# Patient Record
Sex: Male | Born: 1965 | Race: White | Hispanic: No | Marital: Married | Smoking: Never smoker
Health system: Southern US, Community
[De-identification: ages and names within clinical notes are randomized; demographics above are authoritative.]

## PROBLEM LIST (undated history)

## (undated) DIAGNOSIS — Z87442 Personal history of urinary calculi: Secondary | ICD-10-CM

## (undated) DIAGNOSIS — K219 Gastro-esophageal reflux disease without esophagitis: Secondary | ICD-10-CM

## (undated) DIAGNOSIS — N471 Phimosis: Secondary | ICD-10-CM

## (undated) DIAGNOSIS — Z8701 Personal history of pneumonia (recurrent): Secondary | ICD-10-CM

## (undated) DIAGNOSIS — N200 Calculus of kidney: Secondary | ICD-10-CM

## (undated) DIAGNOSIS — M519 Unspecified thoracic, thoracolumbar and lumbosacral intervertebral disc disorder: Secondary | ICD-10-CM

## (undated) DIAGNOSIS — E785 Hyperlipidemia, unspecified: Secondary | ICD-10-CM

## (undated) DIAGNOSIS — Z8739 Personal history of other diseases of the musculoskeletal system and connective tissue: Secondary | ICD-10-CM

## (undated) HISTORY — DX: Calculus of kidney: N20.0

## (undated) HISTORY — DX: Hyperlipidemia, unspecified: E78.5

## (undated) HISTORY — PX: NO PAST SURGERIES: SHX2092

## (undated) HISTORY — PX: WISDOM TOOTH EXTRACTION: SHX21

## (undated) HISTORY — DX: Personal history of pneumonia (recurrent): Z87.01

---

## 1996-11-12 HISTORY — PX: COLONOSCOPY: SHX174

## 2000-07-08 ENCOUNTER — Encounter: Payer: Self-pay | Admitting: Emergency Medicine

## 2000-07-08 ENCOUNTER — Emergency Department (HOSPITAL_COMMUNITY): Admission: EM | Admit: 2000-07-08 | Discharge: 2000-07-08 | Payer: Self-pay | Admitting: Emergency Medicine

## 2003-05-08 ENCOUNTER — Emergency Department (HOSPITAL_COMMUNITY): Admission: EM | Admit: 2003-05-08 | Discharge: 2003-05-08 | Payer: Self-pay

## 2006-01-25 ENCOUNTER — Ambulatory Visit: Payer: Self-pay | Admitting: Internal Medicine

## 2006-06-22 ENCOUNTER — Emergency Department (HOSPITAL_COMMUNITY): Admission: EM | Admit: 2006-06-22 | Discharge: 2006-06-22 | Payer: Self-pay | Admitting: Emergency Medicine

## 2006-12-09 ENCOUNTER — Ambulatory Visit: Payer: Self-pay | Admitting: Internal Medicine

## 2007-03-11 ENCOUNTER — Ambulatory Visit: Payer: Self-pay | Admitting: Internal Medicine

## 2007-03-12 ENCOUNTER — Inpatient Hospital Stay (HOSPITAL_COMMUNITY): Admission: EM | Admit: 2007-03-12 | Discharge: 2007-03-14 | Payer: Self-pay | Admitting: Emergency Medicine

## 2007-03-28 ENCOUNTER — Ambulatory Visit: Payer: Self-pay | Admitting: Internal Medicine

## 2007-06-24 DIAGNOSIS — G4733 Obstructive sleep apnea (adult) (pediatric): Secondary | ICD-10-CM

## 2007-06-24 DIAGNOSIS — N200 Calculus of kidney: Secondary | ICD-10-CM | POA: Insufficient documentation

## 2007-06-25 ENCOUNTER — Ambulatory Visit: Payer: Self-pay | Admitting: Internal Medicine

## 2007-07-02 ENCOUNTER — Telehealth (INDEPENDENT_AMBULATORY_CARE_PROVIDER_SITE_OTHER): Payer: Self-pay | Admitting: *Deleted

## 2007-07-03 ENCOUNTER — Ambulatory Visit: Payer: Self-pay | Admitting: Internal Medicine

## 2007-07-04 ENCOUNTER — Telehealth: Payer: Self-pay | Admitting: Internal Medicine

## 2008-01-15 ENCOUNTER — Encounter: Payer: Self-pay | Admitting: Internal Medicine

## 2008-03-02 ENCOUNTER — Ambulatory Visit: Payer: Self-pay | Admitting: Internal Medicine

## 2008-06-18 IMAGING — CR DG CHEST 1V
1 series · 1 of 1 positions shown · non-contrast
Comparison: None.

CLINICAL DATA: Chest tightness/low back pain. 
 CHEST ? 1 VIEW:

[view not recorded]
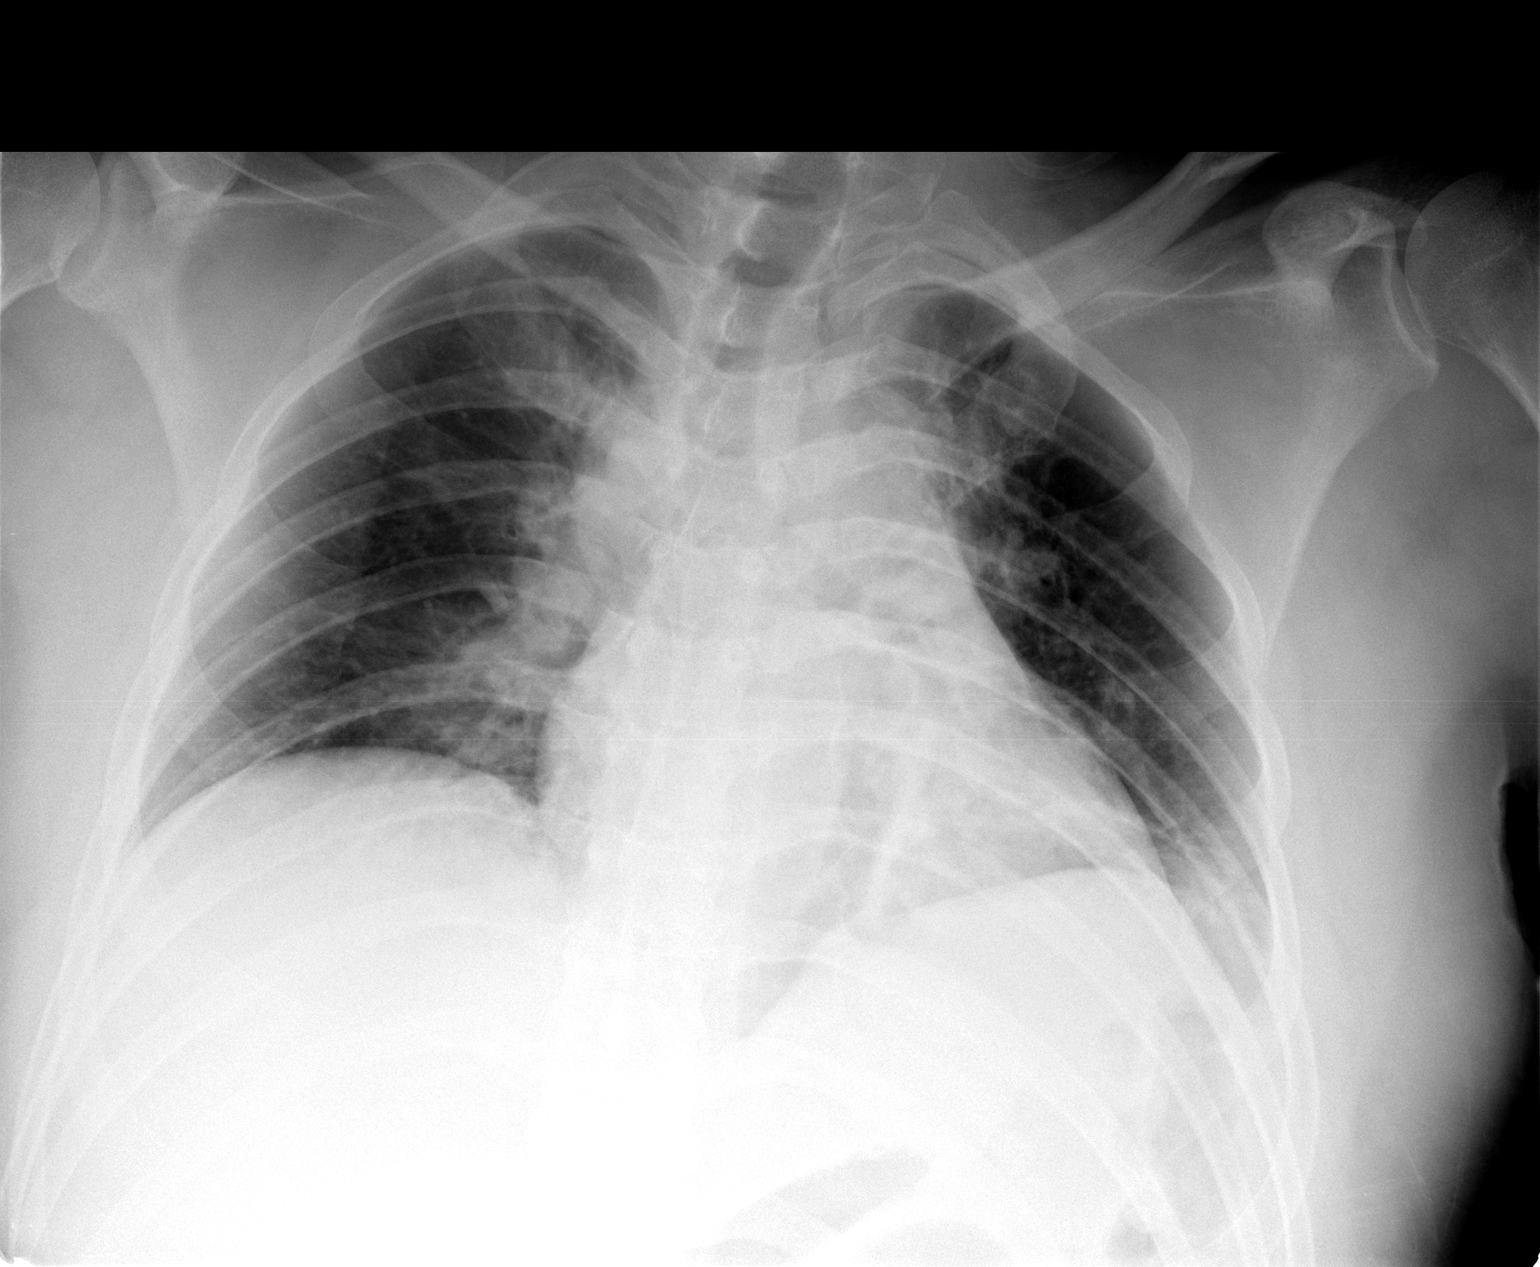

[1 of 1 positions shown; findings below may reference images not displayed]

FINDINGS: This is an AP supine exam with a low level of inspiration.  There are bilateral lobe areas of subsegmental atelectasis or scar.  No definite pneumonia or heart failure.  The mediastinum appears slightly widened but is within normal limits for a supine exam.  
 No gross pleural fluid or pneumothorax.
IMPRESSION: 1.  Limited exam due to AP supine position. 
 2.  Bilateral lower lobe subsegmental atelectasis or scar.

## 2008-11-10 ENCOUNTER — Ambulatory Visit: Payer: Self-pay | Admitting: Internal Medicine

## 2008-11-22 ENCOUNTER — Ambulatory Visit: Payer: Self-pay | Admitting: Internal Medicine

## 2009-08-08 ENCOUNTER — Ambulatory Visit: Payer: Self-pay | Admitting: Internal Medicine

## 2010-03-17 ENCOUNTER — Ambulatory Visit: Payer: Self-pay | Admitting: Internal Medicine

## 2010-03-17 LAB — CONVERTED CEMR LAB
Blood in Urine, dipstick: NEGATIVE
Ketones, urine, test strip: NEGATIVE
Nitrite: NEGATIVE
Protein, U semiquant: NEGATIVE
Specific Gravity, Urine: 1.02
Urobilinogen, UA: 0.2
WBC Urine, dipstick: NEGATIVE

## 2010-03-21 LAB — CONVERTED CEMR LAB
ALT: 242 units/L — ABNORMAL HIGH (ref 0–53)
AST: 96 units/L — ABNORMAL HIGH (ref 0–37)
Alkaline Phosphatase: 53 units/L (ref 39–117)
BUN: 14 mg/dL (ref 6–23)
Basophils Absolute: 0 10*3/uL (ref 0.0–0.1)
Creatinine, Ser: 0.9 mg/dL (ref 0.4–1.5)
Eosinophils Relative: 1.1 % (ref 0.0–5.0)
Glucose, Bld: 89 mg/dL (ref 70–99)
HDL: 40 mg/dL (ref >39.00)
Hemoglobin: 14.5 g/dL (ref 13.0–17.0)
Lymphocytes Relative: 29 % (ref 12.0–46.0)
Monocytes Relative: 8.2 % (ref 3.0–12.0)
Neutro Abs: 4.5 10*3/uL (ref 1.4–7.7)
Platelets: 146 10*3/uL — ABNORMAL LOW (ref 150.0–400.0)
Potassium: 4.8 meq/L (ref 3.5–5.1)
RDW: 13.7 % (ref 11.5–14.6)
Sodium: 143 meq/L (ref 135–145)
Total Bilirubin: 0.7 mg/dL (ref 0.3–1.2)
Triglycerides: 128 mg/dL (ref 0.0–149.0)
VLDL: 25.6 mg/dL (ref 0.0–40.0)
WBC: 7.3 10*3/uL (ref 4.5–10.5)

## 2010-03-24 ENCOUNTER — Ambulatory Visit: Payer: Self-pay | Admitting: Internal Medicine

## 2010-03-24 DIAGNOSIS — R740 Nonspecific elevation of levels of transaminase and lactic acid dehydrogenase [LDH]: Secondary | ICD-10-CM

## 2010-03-24 DIAGNOSIS — R079 Chest pain, unspecified: Secondary | ICD-10-CM | POA: Insufficient documentation

## 2010-03-24 LAB — CONVERTED CEMR LAB
ALT: 227 units/L — ABNORMAL HIGH (ref 0–53)
AST: 89 units/L — ABNORMAL HIGH (ref 0–37)
Alkaline Phosphatase: 54 units/L (ref 39–117)
Bilirubin, Direct: 0.1 mg/dL (ref 0.0–0.3)
Total Protein: 7.1 g/dL (ref 6.0–8.3)

## 2010-03-31 ENCOUNTER — Encounter: Admission: RE | Admit: 2010-03-31 | Discharge: 2010-03-31 | Payer: Self-pay | Admitting: Internal Medicine

## 2010-08-21 ENCOUNTER — Ambulatory Visit: Payer: Self-pay | Admitting: Family Medicine

## 2010-08-21 ENCOUNTER — Ambulatory Visit: Payer: Self-pay

## 2010-08-21 ENCOUNTER — Telehealth (INDEPENDENT_AMBULATORY_CARE_PROVIDER_SITE_OTHER): Payer: Self-pay | Admitting: *Deleted

## 2010-08-21 DIAGNOSIS — R609 Edema, unspecified: Secondary | ICD-10-CM

## 2010-08-21 DIAGNOSIS — M79609 Pain in unspecified limb: Secondary | ICD-10-CM | POA: Insufficient documentation

## 2010-10-09 ENCOUNTER — Ambulatory Visit: Payer: Self-pay | Admitting: Family Medicine

## 2010-12-12 NOTE — Progress Notes (Signed)
----   Converted from flag ---- ---- 08/21/2010 4:14 PM, Danise Edge MD wrote: notify Arlys John scan neg for DVT or clot, official complete document to follow  ---- 08/21/2010 11:01 AM, Missy Al-Rammal, RVT, RDCS wrote: Left leg negative for DVT or superficial clot.  Area of tenderness shows no maas/cyst. ------------------------------  Phone Note Outgoing Call   Summary of Call: I informed pt that the scan came back neg for a DVT or Clot and pt would like to know what it is then and what he should do? Initial call taken by: Josph Macho RMA,  August 21, 2010 4:20 PM  Follow-up for Phone Call        Muscle strain, can take 2-6 weeks to resolve. try moist heat, aspercreme as needed and Aleve 220 mg by mouth two times a day as needed pain with food for next couple weeks. Report if symptoms worsen. For mild swelling at ankles, decrease sodium intake, elevate feet above heart for 10-15 minutes two times a day and consider compression socks if swelling persists Follow-up by: Danise Edge MD,  August 21, 2010 5:03 PM  Additional Follow-up for Phone Call Additional follow up Details #1::        Patient informed Additional Follow-up by: Josph Macho RMA,  August 22, 2010 8:48 AM

## 2010-12-12 NOTE — Assessment & Plan Note (Signed)
Summary: LUMP & SWELLING LEG/SW PT/PS   Vital Signs:  Patient profile:   45 year old male Height:      75.5 inches (191.77 cm) Weight:      285 pounds (129.55 kg) O2 Sat:      98 % on Room air Temp:     98.2 degrees F (36.78 degrees C) oral Pulse rate:   82 / minute BP sitting:   120 / 80  (left arm) Cuff size:   large  Vitals Entered By: Josph Macho RMA (August 21, 2010 8:57 AM)  O2 Flow:  Room air CC: Lump and swelling in left leg X4 days- pain travels from left leg to calf- warm spot with lump on left calf/ CF Is Patient Diabetic? No   History of Present Illness: Patient in today for evaluation of left sided calf pain. Late last week noted some mild b/l calf pain/stiffness after wearing his sandels. The calf pain on the right has resolved and never had any associated swelling. Over the past several days his left calf pain has worsened with some swelling at the ankle as well. They noted some mild warmth, redness over the posterior, caudal calf yesterday but is better today. No CP/palp/SOB/GI or GU c/o. No smoking or trauma.  Current Medications (verified): 1)  Aspirin .... One- Three Daily  Allergies (verified): No Known Drug Allergies  Past History:  Past medical history reviewed for relevance to current acute and chronic problems. Social history (including risk factors) reviewed for relevance to current acute and chronic problems.  Past Medical History: Reviewed history from 11/10/2008 and no changes required. 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 Used to see Dr Aldean Ast Nephrolithiasis, hx of  Social History: Reviewed history from 11/10/2008 and no changes required. Never Smoked Occupation: Physicist, medical  Married Alcohol use-yes  Review of Systems      See HPI  Physical Exam  General:  Well-developed,well-nourished,in no acute distress; alert,appropriate and cooperative throughout  examination Head:  Normocephalic and atraumatic without obvious abnormalities. No apparent alopecia or balding. Lungs:  Normal respiratory effort, chest expands symmetrically. Lungs are clear to auscultation, no crackles or wheezes. Heart:  Normal rate and regular rhythm. S1 and S2 normal without gallop, murmur, click, rub or other extra sounds. Abdomen:  Bowel sounds positive,abdomen soft and non-tender without masses, organomegaly or hernias noted. Extremities:  RLE no edema, pain or joint instability. Left lower extremity with swelling around ankle, no redness or knot over calf. Mild swelling over posterior knee, no tenderness. No ligamentous laxity or joint line tenderness. Psych:  Cognition and judgment appear intact. Alert and cooperative with normal attention span and concentration. No apparent delusions, illusions, hallucinations   Impression & Recommendations:  Problem # 1:  CALF PAIN, LEFT (ICD-729.5)  Orders: Doppler Referral (Doppler) May use Aleve two times a day and encouraged heat and ice and stretching if Doppler is neg for DVT  Complete Medication List: 1)  Aspirin  .... One- three daily

## 2010-12-12 NOTE — Miscellaneous (Signed)
Summary: Orders Update  Clinical Lists Changes  Problems: Added new problem of EDEMA, LIMB (ICD-782.3) Orders: Added new Test order of Venous Duplex Lower Extremity (Venous Duplex Lower) - Signed 

## 2010-12-12 NOTE — Assessment & Plan Note (Signed)
Summary: back pain//ccm   Vital Signs:  Patient profile:   45 year old male Weight:      282 pounds Temp:     98.5 degrees F oral BP sitting:   120 / 84  (left arm) Cuff size:   large  Vitals Entered By: Sid Falcon LPN (October 09, 2010 3:32 PM)  History of Present Illness: Acute visit for low back pain. Onset one week ago. No injury. History of similar pain in the past. Pain is lower lumbar greater on right. Pain is sharp. Worse with change of position. Has tried cyclobenzaprine and diclofenac without improvement. No numbness or weakness. No radiculopathy symptoms. No appetite or weight changes. No dysuria.  Allergies (verified): No Known Drug Allergies  Past History:  Past Medical History: Last updated: 11/10/2008 Low back pain  Pneumonia, hx of Hx of uric acid kidney stones  10 years  onset last one 2 year ago. Used to see Dr Aldean Ast Nephrolithiasis, hx of PMH reviewed for relevance  Review of Systems  The patient denies anorexia, fever, weight loss, hematuria, incontinence, and muscle weakness.    Physical Exam  General:  Well-developed,well-nourished,in no acute distress; alert,appropriate and cooperative throughout examination Lungs:  Normal respiratory effort, chest expands symmetrically. Lungs are clear to auscultation, no crackles or wheezes. Heart:  Normal rate and regular rhythm. S1 and S2 normal without gallop, murmur, click, rub or other extra sounds. Extremities:  No clubbing, cyanosis, edema, or deformity noted with normal full range of motion of all joints.  straight leg raise is negative Neurologic:  full strength with plantar flexion and dorsiflexion bilaterally. Normal sensory function at times.  DTRs are trace ankle and knee bilateral.   Impression & Recommendations:  Problem # 1:  LUMBAGO (ICD-724.2) nonfocal neuro.  Trial of pred taper and refill cyclobenzaprine and hydrocodone for as needed use. His updated medication list for this problem  includes:    Cyclobenzaprine Hcl 10 Mg Tabs (Cyclobenzaprine hcl) ..... One by mouth q 8 hours as needed muscle spasm    Hydrocodone-acetaminophen 5-325 Mg Tabs (Hydrocodone-acetaminophen) .Marland Kitchen... 1-2 by mouth q 4-6 hours as needed pain  Complete Medication List: 1)  Aspirin  .... One- three daily 2)  Prednisone 10 Mg Tabs (Prednisone) .... Taper as follows:  6-5-4-4-3-3-2-1 3)  Cyclobenzaprine Hcl 10 Mg Tabs (Cyclobenzaprine hcl) .... One by mouth q 8 hours as needed muscle spasm 4)  Hydrocodone-acetaminophen 5-325 Mg Tabs (Hydrocodone-acetaminophen) .Marland Kitchen.. 1-2 by mouth q 4-6 hours as needed pain  Patient Instructions: 1)  Touch base if back pain no better in 2 weeks. 2)  Most patients (90%) with low back pain will improve with time ( 2-6 weeks). Keep active but avoid activities that are painful. Apply moist heat and/or ice to lower back several times a day.  Prescriptions: HYDROCODONE-ACETAMINOPHEN 5-325 MG TABS (HYDROCODONE-ACETAMINOPHEN) 1-2 by mouth q 4-6 hours as needed pain  #30 x 0   Entered and Authorized by:   Evelena Peat MD   Signed by:   Evelena Peat MD on 10/09/2010   Method used:   Print then Give to Patient   RxID:   [id number] CYCLOBENZAPRINE HCL 10 MG TABS (CYCLOBENZAPRINE HCL) one by mouth q 8 hours as needed muscle spasm  #30 x 0   Entered and Authorized by:   Evelena Peat MD   Signed by:   Evelena Peat MD on 10/09/2010   Method used:   Electronically to        [facility] (  retail)       [address]       [place], [place]  [address]       Ph: [phone]       Fax: [phone]   RxID:   [id number] PREDNISONE 10 MG TABS (PREDNISONE) taper as follows:  6-5-4-4-3-3-2-1  #28 x 0   Entered and Authorized by:   Evelena Peat MD   Signed by:   Evelena Peat MD on 10/09/2010   Method used:   Electronically to        [facility] ([facility]       [address]       [place], [place]  [address]       Ph: [phone]       Fax: [phone]   RxID:   [id number]    Orders Added: 1)  Est. Patient Level III [[address]]

## 2010-12-12 NOTE — Assessment & Plan Note (Signed)
Summary: CPX // RS   Vital Signs:  Patient profile:   45 year old Mccullough Height:      75.5 inches Weight:      280 pounds BMI:     34.66 Pulse rate:   60 / minute Pulse rhythm:   regular Resp:     12 per minute BP sitting:   128 / 80  (left arm) Cuff size:   regular  Vitals Entered By: Gladis Riffle, RN (Mar 24, 2010 10:01 AM)  Nutrition Counseling: Patient's BMI is greater than 25 and therefore counseled on weight management options. CC: cpx, labs done Is Patient Diabetic? No   CC:  cpx and labs done.  History of Present Illness: CPX  THE reason i made a physical is Chest pain reports 1 month ago he had a few episodes of CP each for 45 seconds no SOB, PND no exertional sxs no recurrent sxs denies GERD  Preventive Screening-Counseling & Management  Alcohol-Tobacco     Smoking Status: never  Current Problems (verified): 1)  ? of Gout, Unspecified  (ICD-274.9) 2)  Calculus, Kidney  (ICD-592.0) 3)  Obstructive Sleep Apnea  (ICD-327.23)  Current Medications (verified): 1)  Hydrocodone-Acetaminophen 5-325 Mg Tabs (Hydrocodone-Acetaminophen) .Marland Kitchen.. 1- 2  By Mouth Up To 4 Times Per Day As Needed For Pain 2)  Cyclobenzaprine Hcl 10 Mg  Tabs (Cyclobenzaprine Hcl) .... Take 1 Tablet By Mouth Three Times A Day As Needed  Allergies (verified): No Known Drug Allergies  Past History:  Past Medical History: Last updated: 11/10/2008 Low back pain  Pneumonia, hx of Hx of uric acid kidney stones  10 years  onset last one 2 year ago. Used to see Dr Aldean Ast Nephrolithiasis, hx of  Past Surgical History: Last updated: 06/24/2007 Denies surgical history  Family History: Last updated: 03/24/2010 neg gout  father-healthy mother-healthy  Social History: Last updated: 11/10/2008 Never Smoked Occupation: Physicist, medical  Married Alcohol use-yes  Risk Factors: Smoking Status: never (03/24/2010)  Family History: neg gout  father-healthy mother-healthy  Physical  Exam  General:  alert and well-developed.   Head:  normocephalic and atraumatic.   Eyes:  pupils equal and pupils round.   Ears:  R ear normal and L ear normal.   Nose:  no external deformity and no external erythema.   Neck:  No deformities, masses, or tenderness noted. Lungs:  normal respiratory effort and no intercostal retractions.   Heart:  normal rate and regular rhythm.   Abdomen:  soft and non-tender.  obese Msk:  No deformity or scoliosis noted of thoracic or lumbar spine.   Pulses:  R radial normal and L radial normal.   Neurologic:  cranial nerves II-XII intact and gait normal.   Skin:  turgor normal and color normal.   Psych:  memory intact for recent and remote and good eye contact.     Impression & Recommendations:  Problem # 1:  Preventive Health Care (ICD-V70.0) health maint utd needs to lose weight regular exercise low calorie diet  Problem # 2:  CHEST PAIN (ICD-786.50) unclear etiology ekg normal no further eval at this time he will call if sxs recur  Problem # 3:  TRANSAMINASES, SERUM, ELEVATED (ICD-790.4) unclear cause repeat labs check viral serologies Orders: Venipuncture (44034) TLB-Hepatic/Liver Function Pnl (80076-HEPATIC) T-Hepatitis B Surface Antigen (74259-56387) T-Hepatitis C Anti HCV (56433) T-Hepatitis B Surface Antibody (29518-84166)  Complete Medication List: 1)  Hydrocodone-acetaminophen 5-325 Mg Tabs (Hydrocodone-acetaminophen) .Marland Kitchen.. 1- 2  by mouth up to 4 times  per day as needed for pain 2)  Cyclobenzaprine Hcl 10 Mg Tabs (Cyclobenzaprine hcl) .... Take 1 tablet by mouth three times a day as needed  Other Orders: Tdap => 72yrs IM (57846) Admin 1st Vaccine (96295)   Immunizations Administered:  Tetanus Vaccine:    Vaccine Type: Tdap    Site: left deltoid    Mfr: GlaxoSmithKline    Dose: 0.5 ml    Route: IM    Given by: Gladis Riffle, RN    Exp. Date: 02/04/2012    Lot #: [id number]  Prevention & Chronic  Care Immunizations   Influenza vaccine: Not documented    Tetanus booster: 03/24/2010: Tdap    Pneumococcal vaccine: Not documented  Other Screening   Smoking status: never  (03/24/2010)  Lipids   Total Cholesterol: 211  (03/17/2010)   LDL: Not documented   LDL Direct: 157.3  (03/17/2010)   HDL: 40.00  (03/17/2010)   Triglycerides: 128.0  (03/17/2010)

## 2011-02-02 ENCOUNTER — Ambulatory Visit: Payer: Self-pay | Admitting: Internal Medicine

## 2011-02-02 ENCOUNTER — Emergency Department (HOSPITAL_COMMUNITY): Payer: BC Managed Care – PPO

## 2011-02-02 ENCOUNTER — Inpatient Hospital Stay (HOSPITAL_COMMUNITY)
Admission: EM | Admit: 2011-02-02 | Discharge: 2011-02-04 | DRG: 324 | Disposition: A | Discharge status: Home / Self Care | Payer: BC Managed Care – PPO | Attending: Internal Medicine | Admitting: Internal Medicine

## 2011-02-02 ENCOUNTER — Inpatient Hospital Stay (HOSPITAL_COMMUNITY): Payer: BC Managed Care – PPO

## 2011-02-02 DIAGNOSIS — M5126 Other intervertebral disc displacement, lumbar region: Secondary | ICD-10-CM | POA: Diagnosis present

## 2011-02-02 DIAGNOSIS — G8929 Other chronic pain: Secondary | ICD-10-CM | POA: Diagnosis present

## 2011-02-02 DIAGNOSIS — M549 Dorsalgia, unspecified: Secondary | ICD-10-CM | POA: Diagnosis present

## 2011-02-02 DIAGNOSIS — N201 Calculus of ureter: Principal | ICD-10-CM | POA: Diagnosis present

## 2011-02-02 LAB — BASIC METABOLIC PANEL
GFR calc non Af Amer: >60 mL/min (ref >60)
Glucose, Bld: 89 mg/dL (ref 70–99)
Potassium: 3.8 mEq/L (ref 3.5–5.1)
Sodium: 141 mEq/L (ref 135–145)

## 2011-02-02 LAB — CBC
Hemoglobin: 15.5 g/dL (ref 13.0–17.0)
MCHC: 32.6 g/dL (ref 30.0–36.0)
WBC: 10.1 10*3/uL (ref 4.0–10.5)

## 2011-02-02 LAB — URINE MICROSCOPIC-ADD ON

## 2011-02-02 LAB — URINALYSIS, ROUTINE W REFLEX MICROSCOPIC
Glucose, UA: NEGATIVE mg/dL
Nitrite: NEGATIVE
Specific Gravity, Urine: 1.03 (ref 1.005–1.030)
pH: 7.5 (ref 5.0–8.0)

## 2011-02-02 LAB — DIFFERENTIAL
Basophils Absolute: 0 10*3/uL (ref 0.0–0.1)
Basophils Relative: 0 % (ref 0–1)
Monocytes Absolute: 0.6 10*3/uL (ref 0.1–1.0)
Neutro Abs: 8.2 10*3/uL — ABNORMAL HIGH (ref 1.7–7.7)
Neutrophils Relative %: 81 % — ABNORMAL HIGH (ref 43–77)

## 2011-02-02 LAB — URIC ACID: Uric Acid, Serum: 9.9 mg/dL — ABNORMAL HIGH (ref 4.0–7.8)

## 2011-02-04 LAB — PHOSPHORUS: Phosphorus: 3.4 mg/dL (ref 2.3–4.6)

## 2011-02-05 LAB — URINE CULTURE: Culture  Setup Time: 201203240046

## 2011-02-07 ENCOUNTER — Encounter: Payer: Self-pay | Admitting: Internal Medicine

## 2011-02-07 NOTE — Consult Note (Signed)
  NAME:  Antonio Mccullough, Antonio Mccullough                  ACCOUNT NO.:  [id number]  MEDICAL RECORD NO.:  [id number]           PATIENT TYPE:  I  LOCATION:  1339                         FACILITY:  [facility]  PHYSICIAN:  Elissia Spiewak I. Patsi Sears, M.D.DATE OF BIRTH:  05/10/1966  DATE OF CONSULTATION:  02/03/2011 DATE OF DISCHARGE:                                CONSULTATION   HISTORY:  Antonio Mccullough is a 45 year old married male, employee at [employer], with a history of uric acid calculi, previously cared for by Dr. Wanda Plump of our practice.  The patient was admitted by Dr. Cato Mulligan on February 02, 2011, with back pain, associated with herniated disk as well as nausea but no vomiting.  He has had uric acid stones in the past and CT scan in the emergency room showed a left ureteral calculus, 5 mm, in the left lower ureter.  A urinalysis was accomplished showing 21 to  50 red blood cells and a urine pH of 7.5.  The patient was admitted by Dr. Cato Mulligan for pain control evaluation.  PAST MEDICAL HISTORY: 1. Herniated L4-L5 with peripheral injection. 2. Sleep apnea. 3. Gout.  MEDICATIONS:  Include: 1. Flexeril. 2. Vicodin.  ALLERGIES:  None known.  REVIEW OF SYSTEMS:  Significant for constipation.  Remaining review of systems is significant for nausea, flank pain.  No shortness of breath. No chest pain, no headache, plus nausea, no vomiting.  The patient has constipation but no diarrhea.  PHYSICAL EXAMINATION:  GENERAL:  Physical exam shows a well-developed, well-nourished white male, in no acute distress. VITAL SIGNS:  Are noted in the chart.  Remaining physical exam shows the patient has benign abdomen with soft abdomen.  There is no left flank pain.  No left lower quadrant pain. GENITOURINARY:  Exam is normal with normal testicles bilaterally. EXTREMITIES:  Normal.  IMAGING STUDIES:  CT scan is reviewed and shows a 5-mm left lower ureteral calculus which I can see with CT scan and KUB.  This stone  probably represents a calcium oxalate stone.  The patient's urine pH is high enough to allow dissolution of any uric acid calculus. The patient should be able to pass the stone with medical expulsive therapy and will begin the patient on tamsulosin 0.4 mg 1 p.o. per day with first dose now.  We will also give the patient Toradol 30 mg IV q.6 x24 hours.  The patient will continue to have medical therapy for his back pain per the hospitalist.     Said Rueb I. Patsi Sears, M.D.     SIT/MEDQ  D:  02/03/2011  T:  02/03/2011  Job:  [id number]  cc:   Valetta Mole. Swords, MD [address] [place] [address]  Electronically Signed by Jethro Bolus M.D. on 02/07/2011 09:50:31 AM

## 2011-02-08 ENCOUNTER — Encounter: Payer: Self-pay | Admitting: Internal Medicine

## 2011-02-08 ENCOUNTER — Ambulatory Visit (INDEPENDENT_AMBULATORY_CARE_PROVIDER_SITE_OTHER): Payer: BC Managed Care – PPO | Admitting: Internal Medicine

## 2011-02-08 DIAGNOSIS — N200 Calculus of kidney: Secondary | ICD-10-CM

## 2011-02-08 DIAGNOSIS — M549 Dorsalgia, unspecified: Secondary | ICD-10-CM | POA: Insufficient documentation

## 2011-02-08 NOTE — Assessment & Plan Note (Signed)
Has had chronic trouble with back---reviewed mri with patient and mother (from 2008) Probably best to see PMR for ongoing treatment/PT

## 2011-02-08 NOTE — Assessment & Plan Note (Signed)
sxs resolved No further evaluation at this time

## 2011-02-08 NOTE — Progress Notes (Signed)
  Subjective:    Patient ID: Antonio Mccullough, male    DOB: 04-08-1966, 45 y.o.   MRN: [id number]  HPI  Back pain---recent hospitaliziation. There has been debate about kidney stone as only cause of pain or whether herniated discs play some role.  Currently has low back pain with any activity.  Initially he had some bilateral thigh pain but the back pain. That has resolved. He has been spending a lot of time lying flat. In that position he has no pain. Pain only occurs when he's trying to bend over. Currently no basilar pain. He denies any large joint weakness.  Past Medical History  Diagnosis Date  . H/O: pneumonia   . Uric acid kidney stone    No past surgical history on file.  reports that he has never smoked. He does not have any smokeless tobacco history on file. He reports that he drinks alcohol. His drug history not on file. family history is not on file. No Known Allergies   Review of Systems     patient denies chest pain, shortness of breath, orthopnea. Denies lower extremity edema, abdominal pain, change in appetite, change in bowel movements. Patient denies rashes, musculoskeletal complaints. No other specific complaints in a complete review of systems.   Objective:   Physical Exam No acute distress. Overweight male. He is walking with a walker. Neck is supple. No tenderness to palpation of the back. Deep tendon reflexes at the knee and ankle are normal. Strength is normal in the lower extremies.       Assessment & Plan:

## 2011-02-11 NOTE — H&P (Signed)
NAME:  Antonio Mccullough, Antonio Mccullough                  ACCOUNT NO.:  [id number]  MEDICAL RECORD NO.:  [id number]           PATIENT TYPE:  E  LOCATION:  WLED                         FACILITY:  [facility]  PHYSICIAN:  Makynzee Tigges, DO         DATE OF BIRTH:  1966/05/19  DATE OF ADMISSION:  02/02/2011 DATE OF DISCHARGE:                             HISTORY & PHYSICAL   PRIMARY CARE PHYSICIAN:  Valetta Mole. Swords, MD  CHIEF COMPLAINT:  Back pain.  HISTORY OF PRESENT ILLNESS:  This is a 45 year old male with a history of both uric acid kidney stones as well as herniated disk.  He presents today describing 2 types of back pain.  The first is described as a "white hot" back spasm that began Wednesday morning after lifting several heavy objects, the second pain started at approximately 2 a.m., Thursday morning, it felt as though he had to have a bowel movement initially and could not make a pass.  The pain increased and became similar in nature to what he has had with his previous kidney stones. Antonio Mccullough denies any urinary or bowel incontinence.  He denies any fever, vomiting, however, he endorses anorexia for the past 2 days nausea and constipation.  PAST MEDICAL HISTORY:  Significant for, 1. Herniated disk L4-L5, status post epidural injection. 2. Obstructive sleep apnea. 3. Uric acid kidney stone. 4. Gout.  He has had no surgeries.  HOME MEDICATIONS: 1. Flexeril 10 mg 1 tablet p.o. p.r.n. back spasm. 2. Vicodin 5/325 mg 1-2 tablets p.o. p.r.n. pain.  ALLERGIES:  He has no known drug allergies.  REVIEW OF SYSTEMS:  As mentioned in the HPI is positive for constipation and anorexia.  He has had no bowel movement for 3 days, normally has daily bowel movements.  Otherwise, the patient denies any fever, night sweats, or insomnia.  HEAD:  He denies any headache, changes in his vision, pain in his ears, eyes, nose, or throat.  CHEST:  He denies any chest pain, palpitations, orthopnea, or PND.  RESPIRATORY:  He  denies any difficulty breathing, cough, or hemoptysis.  GASTROINTESTINAL:  He endorses nausea and is here with left-sided flank pain as well as back pain.  Denies any vomiting and as mentioned he has had constipation. GENITOURINARY:  He has no difficulty passing urine.  He denies any frank blood in his urine or frequency.  EXTREMITIES:  He denies any numbness or tingling in his extremities.  He denies any decreased strength, however, he is unable to stand secondary to pain.  SKIN:  He denies any rashes, bruises, or lesions.  NEUROLOGIC:  He denies any syncope or seizures.  PSYCHIATRIC: Denies any depression or suicidal ideations.  FAMILY HISTORY:  Significant for his mother and father who are alive and well.  He has a half-brother and half-sister who also alive and well.  SOCIAL HISTORY:  Negative for tobacco.  Positive for 2 beers daily. Negative for any recreational drug use.  He is married, with children. He is a full code.  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  PHYSICAL EXAMINATION:  GENERAL:  This  is a well-developed, well- nourished, overweight Caucasian male who is lying in no apparent distress in the [facility] ED. VITAL SIGNS:  Temperature 98.4, pulse 60, respirations 20, blood pressure 127/77. HEENT:  Head is atraumatic, normocephalic.  Eyes are anicteric with pupils that are equal, round, reactive to light.  His nose shows no nasal discharge or exterior lesions.  Mouth has slightly dry mucous membranes and that he had a geographic tongue, but no exudates or erythema in his oropharynx.  He has good dentition. NECK:  Supple with midline trachea.  No JVD.  No lymphadenopathy.  No carotid bruits to my auscultation. HEART:  Regular rate and rhythm without obvious murmurs, rubs, or gallops. CHEST:  Lungs sounds clear to auscultation without wheezes, crackles, or rales. ABDOMEN:  Currently soft, nontender, nondistended with good bowel sounds.  He has no appreciable  masses. EXTREMITIES:  No clubbing, cyanosis, or edema.  His peripheral pulses are 2+ and intact.  He does have positive bilateral straight leg raise sign. NEUROLOGIC:  Motor and sensation are both intact bilaterally.  He has no facial asymmetries, no obvious focal neuro deficits.  SKIN:  No rashes, bruises, or lesions. PSYCHIATRIC:  The patient is alert and oriented.  His demeanor is cooperative and appropriate.  His grooming is good.  LABORATORY DATA:  Pertinent for a UA that shows large blood with 0-2 WBCs, no nitrites, no leukocyte esterase.  Hemoglobin 15.5, hematocrit 47.6, white count 10.1, platelets 149, BUN 15, creatinine 1.15, sodium 141, potassium 3.8, glucose 89.  RADIOLOGICAL EXAM:  Pending and include a KUB of his abdomen as well as a noncontrast CT abdomen and pelvis and a renal ultrasound.  ASSESSMENT:  Dr. Fran Lowes has seen and examined the patient, collected history, reviewed his chart, and spoken at length with the patient as well as the PA about the case, her impression is that this is 1. A 45 year old gentleman with severe back pain, unable to stand, who     presents to the ED with the following acute medical conditions: 2. Ureterolithiasis. 3. Obstructive uropathy. 4. Musculoskeletal back pain. 5. Dehydration. 6. Constipation. 7. Thrombocytopenia.  PLAN: 1. We will admit him to a regular bed.  For his ureterolithiasis, we     will check a KUB and noncontrast CT and a renal ultrasound.  We     will provide pain control and IV fluid rehydration.  We will     consider a urology consult if necessary. 2. For his musculoskeletal back pain, he will receive pain     medications, rest, and physical therapy. 3. For his constipation, he will receive stool softeners. 4. For his thrombocytopenia, we will simply monitor this in-house and     for venous thromboembolism     prophylaxis, he will have SCDs. 5. This patient is a full code.  Further recommendations will  be     forthcoming pending this patient's medical evolution.     Stephani Police, PA   ______________________________ Fran Lowes, DO    MLY/MEDQ  D:  02/02/2011  T:  02/02/2011  Job:  [id number]  cc:   Valetta Mole. Swords, MD [address] [place] [address]  Electronically Signed by Algis Downs PA on 02/06/2011 09:37:07 AM Electronically Signed by Fran Lowes DO on 02/11/2011 07:28:05 PM

## 2011-02-12 ENCOUNTER — Ambulatory Visit: Payer: BC Managed Care – PPO | Attending: Internal Medicine

## 2011-02-12 DIAGNOSIS — M25659 Stiffness of unspecified hip, not elsewhere classified: Secondary | ICD-10-CM | POA: Insufficient documentation

## 2011-02-12 DIAGNOSIS — M545 Low back pain, unspecified: Secondary | ICD-10-CM | POA: Insufficient documentation

## 2011-02-12 DIAGNOSIS — R5381 Other malaise: Secondary | ICD-10-CM | POA: Insufficient documentation

## 2011-02-12 DIAGNOSIS — IMO0001 Reserved for inherently not codable concepts without codable children: Secondary | ICD-10-CM | POA: Insufficient documentation

## 2011-02-19 ENCOUNTER — Ambulatory Visit: Payer: BC Managed Care – PPO

## 2011-02-22 ENCOUNTER — Ambulatory Visit: Payer: BC Managed Care – PPO | Admitting: Physical Therapy

## 2011-02-27 ENCOUNTER — Ambulatory Visit: Payer: BC Managed Care – PPO | Admitting: Physical Therapy

## 2011-03-01 ENCOUNTER — Ambulatory Visit: Payer: BC Managed Care – PPO

## 2011-03-06 ENCOUNTER — Ambulatory Visit: Payer: BC Managed Care – PPO | Admitting: Physical Therapy

## 2011-03-09 ENCOUNTER — Ambulatory Visit: Payer: BC Managed Care – PPO | Admitting: Physical Therapy

## 2011-03-13 ENCOUNTER — Ambulatory Visit: Payer: BC Managed Care – PPO | Attending: Internal Medicine | Admitting: Physical Therapy

## 2011-03-13 DIAGNOSIS — M545 Low back pain, unspecified: Secondary | ICD-10-CM | POA: Insufficient documentation

## 2011-03-13 DIAGNOSIS — IMO0001 Reserved for inherently not codable concepts without codable children: Secondary | ICD-10-CM | POA: Insufficient documentation

## 2011-03-13 DIAGNOSIS — R5381 Other malaise: Secondary | ICD-10-CM | POA: Insufficient documentation

## 2011-03-13 DIAGNOSIS — M25659 Stiffness of unspecified hip, not elsewhere classified: Secondary | ICD-10-CM | POA: Insufficient documentation

## 2011-03-16 ENCOUNTER — Ambulatory Visit: Payer: BC Managed Care – PPO | Admitting: Physical Therapy

## 2011-03-27 NOTE — Discharge Summary (Signed)
NAME:  Antonio Mccullough, Antonio Mccullough                  ACCOUNT NO.:  [id number]   MEDICAL RECORD NO.:  [id number]          PATIENT TYPE:  INP   LOCATION:  1321                         [facility]ACILITY[facility]  [facility]   PHYSICIAN:  Valerie A. Felicity Coyer, MDDATE OF BIRTH:  [birth year]   DATE OF ADMISSION:  03/12/2007  DATE OF DISCHARGE:  03/14/2007                               DISCHARGE SUMMARY   DISCHARGE DIAGNOSES:  1. Severe low back pain with limitation of movement secondary to      herniated L4-5 with protrusion to the right and moderate spinal      stenosis status post ESI, medication management for home health PT      and outpatient followup.  2. Atypical pneumonia transient hypoxia.  Continue antibiotic therapy.  3. Suspected obstructive sleep apnea, exacerbated by narcotics for      outpatient follow-up with primary MD to consider sleep study.  4. Chronic cough, question due to infection.  Plus/minus allergies      and/or reflux.  Continue treatment as below.   DISCHARGE MEDICATIONS:  1. Protonix 40 mg once daily times 14 days.  2. Claritin 10 mg once daily times 14 days.  3. Mucinex 600 mg p.o. b.i.d. times 14 days and p.r.n.  4. Avelox 400 mg once daily times five additional days to complete 7-      day course.  5. Flexeril 10 mg 1/2 tablet to one tablet p.o. q. 6-8 hours p.r.n.      muscle spasm.  6. Ultram 50 mg 1-2 p.o. q.4 h p.r.n. pain.  7. Vicodin 1-2 p.o. q.6 h p.r.n. severe pain.  8. Prednisone dose pack to continue as outpatient as directed through      May 7.   PROCEDURES/TESTS:  This hospitalization, April 29 an MRI of the L-spine  done at [facility] showed a L4-L5 small to moderate-sized broad-based  disk protrusion with greater extension towards right and moderate spinal  stenosis L4-5 per Dr. Constance Goltz.  Also, CT angio chest April 30, showing  bilateral lobe infiltrates and small bilateral effusions but no PE a  normal thoracic aorta.  Procedures by Dr. Alfredo Batty of Interventional  radiology  included an epidural steroid injection on April 30th.   CONSULTATIONS:  Interventional radiology as well as PT/OT and care  management.   DISPOSITION:  The patient is discharged home today medically stable in  improved condition.  He is still limited in his motion, and per PT has  been recommended for home health for progressive mobility as well as for  use of 3-in-1 rolling walker.  These been arranged prior to discharge.  Hospital follow-up is scheduled with orthopedic back specialist, Dr. Sharolyn Douglas for next week, Tuesday, May 6 at 10:15 a.m.  The patient is also  instructed to call his primary care physician Dr. Birdie Sons in the  next week or so regarding follow-up on this cough, evaluation of  possible sleep apnea and general medical health   CONDITION ON DISCHARGE:  Medically improved.   HOSPITAL COURSE:  #1 - INTRACTABLE BACK PAIN.  The patient is a pleasant  45 year old gentleman with history of previous low back injury and  strain from ruptured disk approximately 4 years ago who had been doing  reasonably well with only minor twinges now and then, over the last  several years.  He came to the emergency room day of admission due to  the onset of sudden severe low back pain when he picked up his 103 pound  41-year-old twin child.  The onset of this pain caused him to drop his  daughter and fall to the floor and severe spasms persisted prompting ER  evaluation.  Once in the emergency room, the patient was aggressively  treated by EDP with Toradol, morphine, Zofran, Dilaudid, Valium and  eventually a Duragesic patch without significant relief of pain.  An MRI  was done which confirmed L4-5 herniation with protrusion to the right.  Once returned from MRI, the patient was heavily sedated and was  experiencing hypoxia clinically due to sleep apnea exacerbated by large  doses of narcotics and benzodiazepines and thus referred for admission  due to this and his inability to regain  movement.  Initially, he was  placed in observation, but as he persisted with inability to move the  following morning as well as sedation on these medications, his  Duragesic patch was discontinued and Interventional Radiology saw him  for consideration of a steroid injection.  This was performed the  morning of April 30th, and the patient failed to have immediate release  from this.  He continue to use Flexeril as well as Vicodin throughout  the day improving his sedation level, but still with limited movement.  Because of persisting mild hypoxia, a CT chest angio was done to exclude  PE and other pulmonary disease.  Please see process below.  PT/OT saw  the patient who recommended home health as well as rolling walker and 3-  in-1.  This was arranged.  By the day of discharge, after also being  started on a prednisone dose pack for anti-inflammatory release, the  patient has had decreased pain, decreased spasm and has walked slowly  independently ambulatory, although cautious regarding return to his two-  level home.  He will be arranged for home health and close outpatient  follow-up with Dr. Sharolyn Douglas, orthopedic back specialist.  This has been  arranged for early next week.  This has been explained to the patient  and prescriptions have been provided as listed above   #2 - ATYPICAL PNEUMONIA.  The patient complained of cough and had  transient hypoxia noted on admission as explained above.  The CT chest  was performed April 30 to exclude PE or other disease.  He was found to  have bilateral lower lobe infiltrate, and thus started empirically on  Avelox for treatment of possible atypical pneumonia.  This, as well as  aggressive medical treatment of his cough with proton pump inhibitor,  antihistamine, antitussives did show no significant improvement in his  symptoms which also, in the reduction of cough, has reduced spasm on his back as well.  His O2 saturations at the time of  discharge were 92-93%  on room air.  With the above treatment as well as avoidance of heavy  sedation, further treatment of problem #1, he is felt stable for  discharge home.  He has been instructed on the need for possible  evaluation of sleep apnea as observed during this hospitalization and is  to be deferred to his primary MD to arrange in follow-up.  Valerie A. Felicity Coyer, MD  Electronically Signed     VAL/MEDQ  D:  03/14/2007  T:  03/14/2007  Job:  [id number]

## 2012-05-12 IMAGING — US US RENAL
1 series · 14 of 25 positions shown · non-contrast
Comparison: CT same date.

CLINICAL DATA: Left flank pain and dysuria.  Question
pyelonephritis.

RENAL/URINARY TRACT ULTRASOUND COMPLETE

[Series 1: us renal · 0.42mm/px · 14 of 38 slices shown]
[im 1/38]
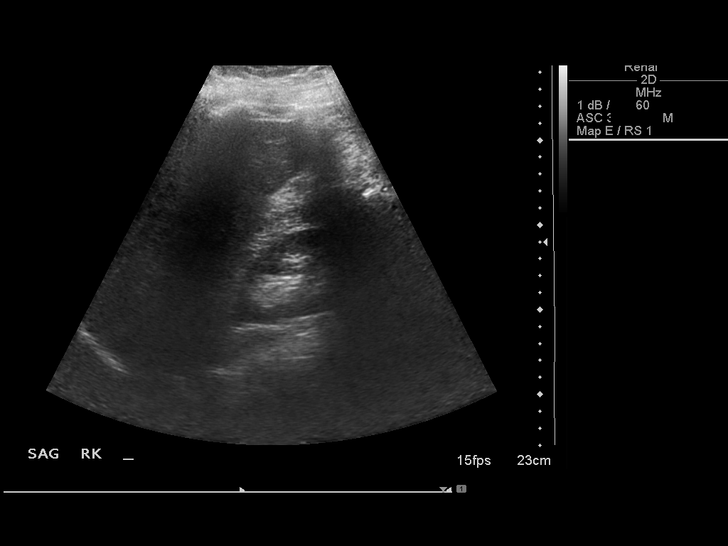
[im 4/38]
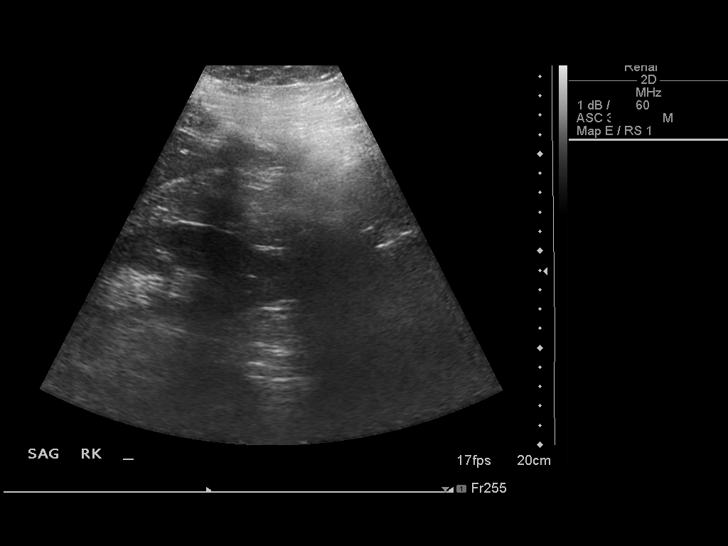
[im 7/38]
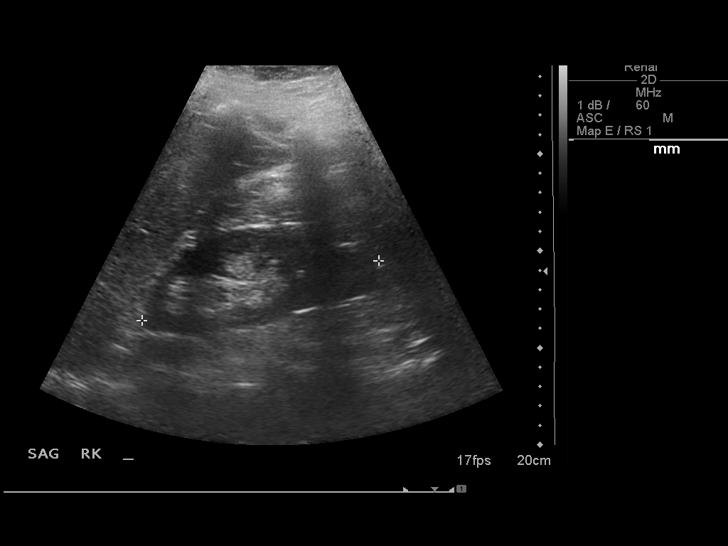
[im 10/38]
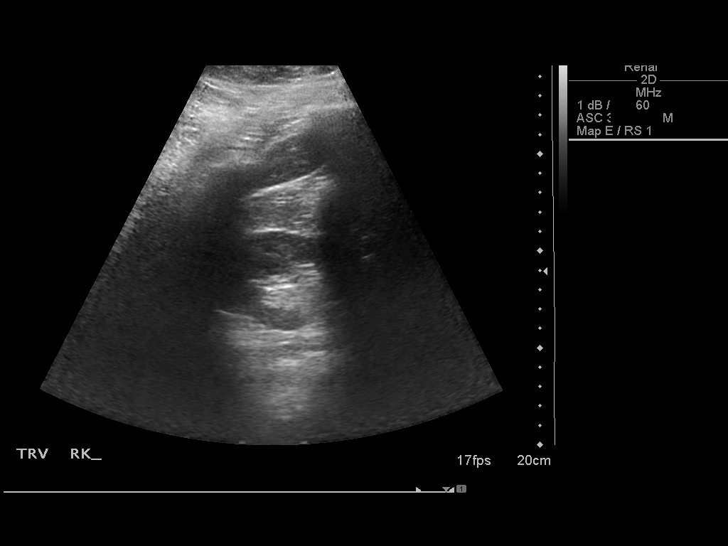
[im 13/38]
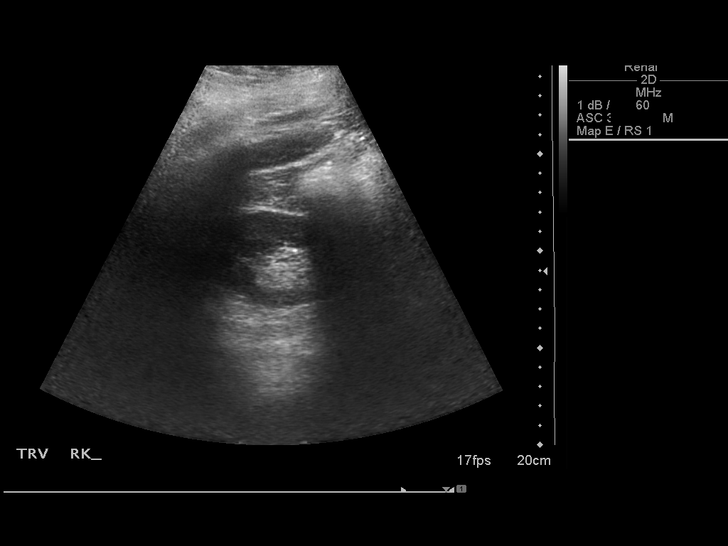
[im 14/38]
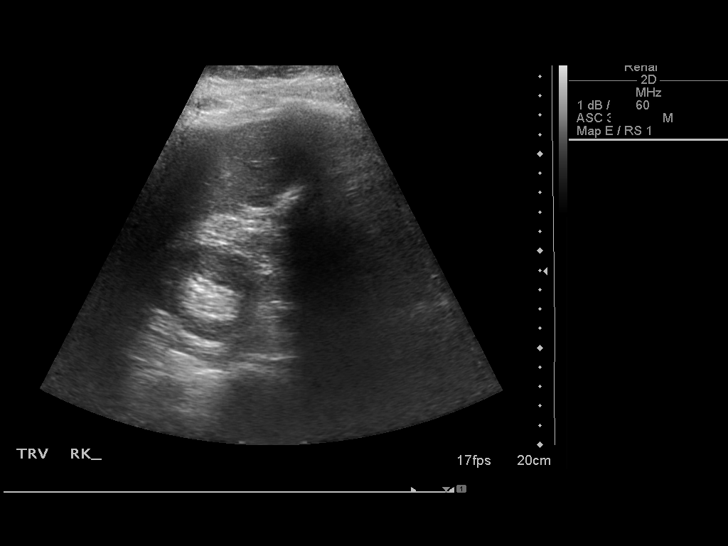
[im 17/38]
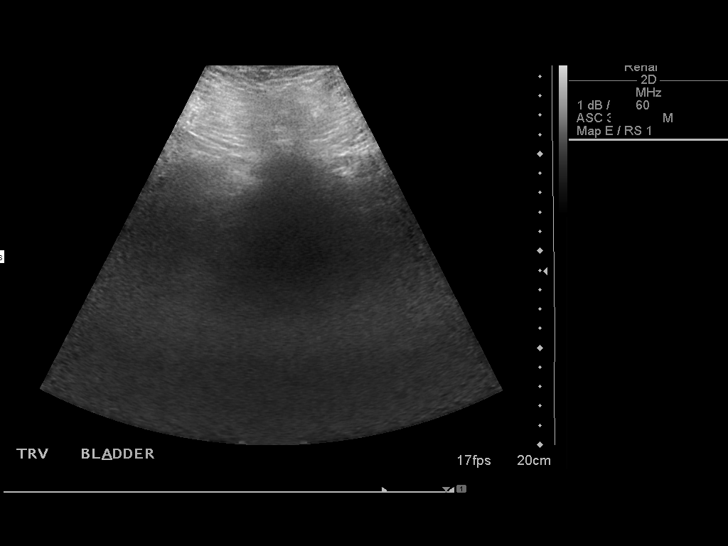
[im 21/38]
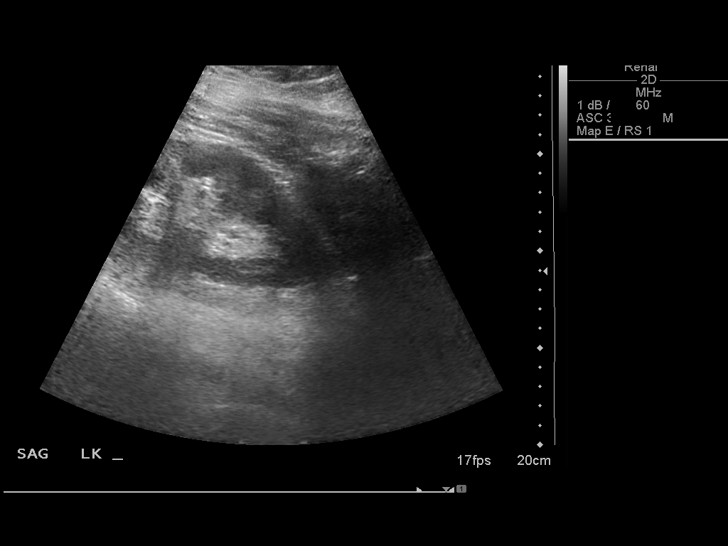
[im 24/38]
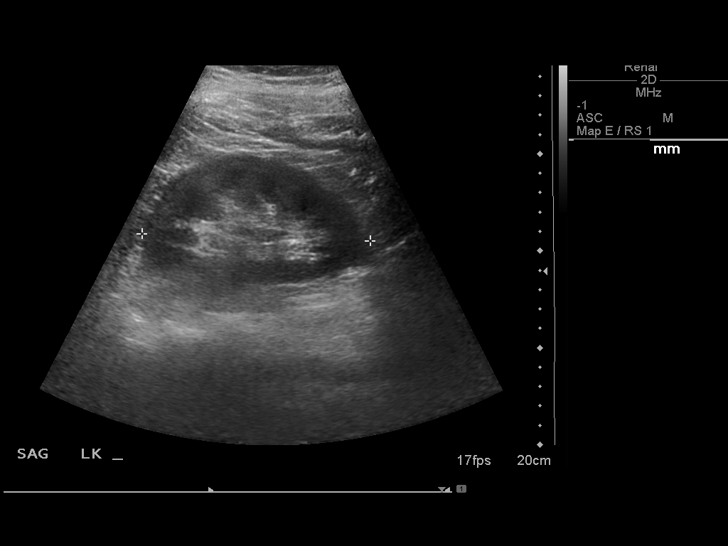
[im 25/38]
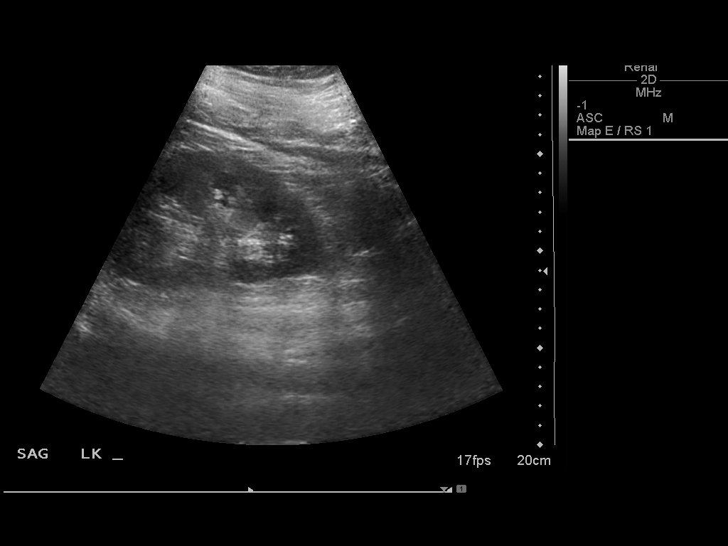
[im 28/38]
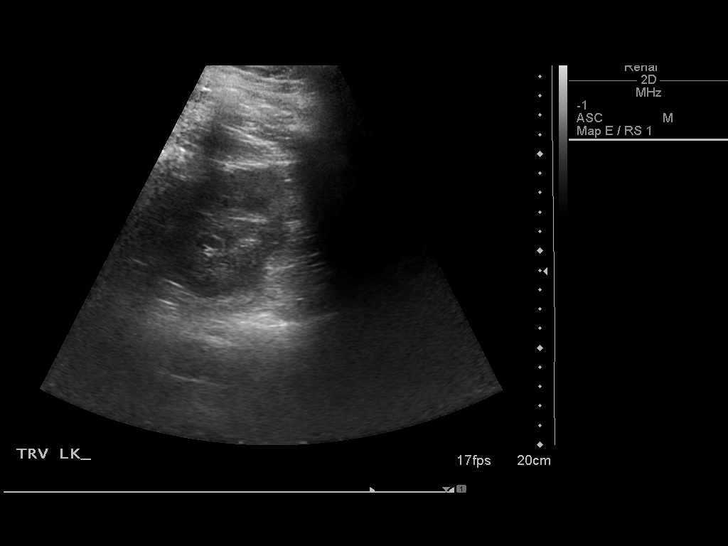
[im 31/38]
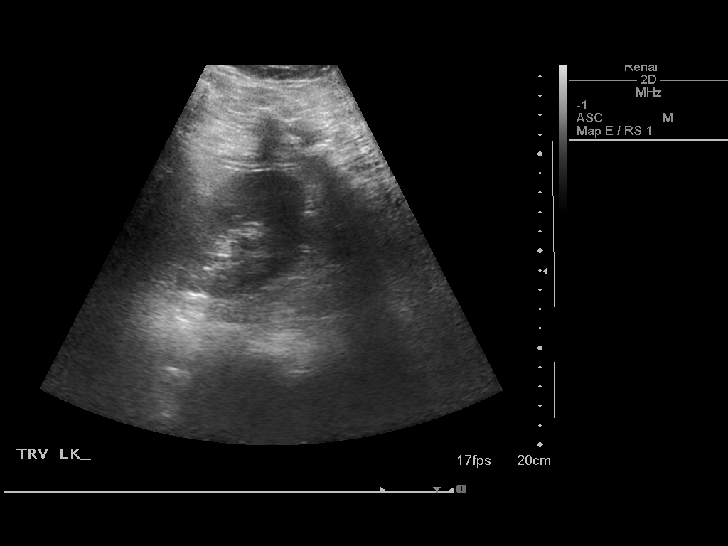
[im 34/38]
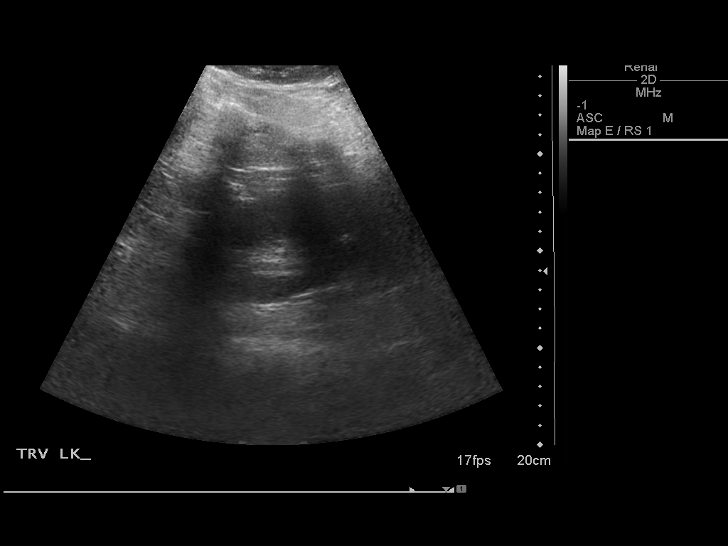
[im 38/38]
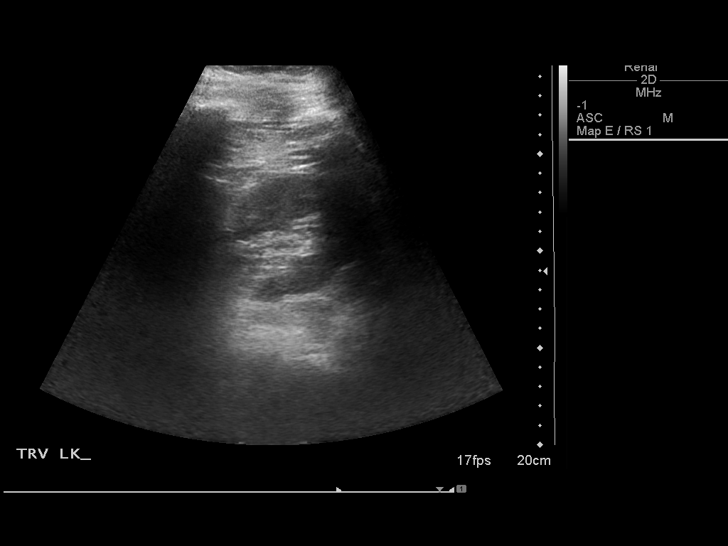

[14 of 25 positions shown; findings below may reference images not displayed]

FINDINGS: Right Kidney:  No hydronephrosis.  Well-preserved cortex.  Normal
size and parenchymal echotexture without focal abnormalities.
Renal length 12.9 cm.

Left Kidney:  There is slight dilatation of the left renal pelvis
and proximal ureter, possibly improved compared with the earlier
CT.  The renal cortex appears normal without thinning or abnormal
echotexture.  There is no perinephric abnormality.  The renal
length is 12.6 cm.

Bladder:  And two and suboptimally evaluated.
IMPRESSION: 1.  No evidence of pyelonephritis.
2.  Obstructing calculus in the distal left ureter and associated
hydronephrosis were better demonstrated on preceding CT.

## 2012-09-09 ENCOUNTER — Emergency Department (HOSPITAL_COMMUNITY)
Admission: EM | Admit: 2012-09-09 | Discharge: 2012-09-10 | Disposition: A | Discharge status: Home / Self Care | Payer: BC Managed Care – PPO | Attending: Emergency Medicine | Admitting: Emergency Medicine

## 2012-09-09 ENCOUNTER — Encounter (HOSPITAL_COMMUNITY): Payer: Self-pay | Admitting: *Deleted

## 2012-09-09 DIAGNOSIS — Z8701 Personal history of pneumonia (recurrent): Secondary | ICD-10-CM | POA: Insufficient documentation

## 2012-09-09 DIAGNOSIS — R112 Nausea with vomiting, unspecified: Secondary | ICD-10-CM | POA: Insufficient documentation

## 2012-09-09 DIAGNOSIS — Z87442 Personal history of urinary calculi: Secondary | ICD-10-CM | POA: Insufficient documentation

## 2012-09-09 DIAGNOSIS — Z79899 Other long term (current) drug therapy: Secondary | ICD-10-CM | POA: Insufficient documentation

## 2012-09-09 DIAGNOSIS — N23 Unspecified renal colic: Secondary | ICD-10-CM | POA: Insufficient documentation

## 2012-09-09 LAB — URINALYSIS, ROUTINE W REFLEX MICROSCOPIC
Bilirubin Urine: NEGATIVE
Glucose, UA: NEGATIVE mg/dL
Specific Gravity, Urine: 1.019 (ref 1.005–1.030)

## 2012-09-09 LAB — URINE MICROSCOPIC-ADD ON

## 2012-09-09 MED ORDER — ONDANSETRON HCL 4 MG/2ML IJ SOLN
4.0000 mg | Freq: Once | INTRAMUSCULAR | Status: AC
  Administered 2012-09-09: 4 mg via INTRAVENOUS
  Filled 2012-09-09: qty 2

## 2012-09-09 MED ORDER — HYDROMORPHONE HCL PF 1 MG/ML IJ SOLN: 1.0000 mg | Freq: Once | INTRAMUSCULAR | Status: DC

## 2012-09-09 MED ORDER — FENTANYL CITRATE 0.05 MG/ML IJ SOLN
50.0000 ug | Freq: Once | INTRAMUSCULAR | Status: AC
  Administered 2012-09-09: 50 ug via INTRAVENOUS
  Filled 2012-09-09: qty 2

## 2012-09-09 NOTE — ED Notes (Signed)
Pt c/o right flank pain x 3 hours; history of kidney stones; vomiting

## 2012-09-09 NOTE — ED Provider Notes (Signed)
History     CSN: [id number]  Arrival date & time 09/09/12  2223   First MD Initiated Contact with Patient 09/09/12 2304      Chief Complaint  Patient presents with  . Flank Pain    (Consider location/radiation/quality/duration/timing/severity/associated sxs/prior treatment) HPI Hx per PT. R flank pain onset around 6:30pm tonight. HAd associated N/V unable to hold anything down. Pain radiated to RLQ, sharp in quality, severe pain. H/o same with multiple kidney stones, followed by Urology, has always been able to pass stones. No F/C. No known agrevating or alleviating factors Past Medical History  Diagnosis Date  . H/O: pneumonia   . Uric acid kidney stone     History reviewed. No pertinent past surgical history.  No family history on file.  History  Substance Use Topics  . Smoking status: Never Smoker   . Smokeless tobacco: Not on file  . Alcohol Use: Yes      Review of Systems  Constitutional: Negative for fever and chills.  HENT: Negative for neck stiffness.   Eyes: Negative for pain.  Respiratory: Negative for shortness of breath.   Cardiovascular: Negative for chest pain.  Gastrointestinal: Positive for nausea and vomiting.  Genitourinary: Positive for hematuria and flank pain.  Musculoskeletal: Negative for back pain.  Skin: Negative for rash.  Neurological: Negative for headaches.  All other systems reviewed and are negative.    Allergies  Review of patient's allergies indicates no known allergies.  Home Medications   Current Outpatient Rx  Name Route Sig Dispense Refill  . HYDROCODONE-ACETAMINOPHEN 5-325 MG PO TABS Oral Take 1 tablet by mouth every 6 (six) hours as needed. Pain    . OMEPRAZOLE 40 MG PO CPDR Oral Take 40 mg by mouth daily.        BP 177/90  Pulse 63  Temp 97.8 F (36.6 C) (Oral)  Resp 24  SpO2 100%  Physical Exam  Constitutional: He is oriented to person, place, and time. He appears well-developed and well-nourished.  HENT:   Head: Normocephalic and atraumatic.  Eyes: EOM are normal. Pupils are equal, round, and reactive to light. No scleral icterus.  Neck: Neck supple.  Cardiovascular: Normal rate, regular rhythm and intact distal pulses.   Pulmonary/Chest: Effort normal and breath sounds normal. No respiratory distress.  Abdominal: Soft. Bowel sounds are normal. He exhibits no distension. There is no tenderness. There is no rebound and no guarding.       No CVAT  Musculoskeletal: Normal range of motion. He exhibits no edema.  Neurological: He is alert and oriented to person, place, and time.  Skin: Skin is warm and dry.    ED Course  Procedures (including critical care time)  IVFs. IV Fentanyl  Results for orders placed during the hospital encounter of 09/09/12  URINALYSIS, ROUTINE W REFLEX MICROSCOPIC      Component Value Range   Color, Urine YELLOW  YELLOW   APPearance CLOUDY (*) CLEAR   Specific Gravity, Urine 1.019  1.005 - 1.030   pH 7.5  5.0 - 8.0   Glucose, UA NEGATIVE  NEGATIVE mg/dL   Hgb urine dipstick LARGE (*) NEGATIVE   Bilirubin Urine NEGATIVE  NEGATIVE   Ketones, ur 40 (*) NEGATIVE mg/dL   Protein, ur NEGATIVE  NEGATIVE mg/dL   Urobilinogen, UA 1.0  0.0 - 1.0 mg/dL   Nitrite NEGATIVE  NEGATIVE   Leukocytes, UA TRACE (*) NEGATIVE  URINE MICROSCOPIC-ADD ON      Component Value Range  WBC, UA 0-2  <3 WBC/hpf   RBC / HPF 11-20  <3 RBC/hpf   Urine-Other MUCOUS PRESENT      11:44 PM recheck pain resolved. PT feels like stone has passed in to his bladder, he declines CT scan and also feel that he does not need one with symptoms resolved and h/o stones with presentation that suggest the same. PO challenge - tolerating fluids. PT feels comfortable with plan d/c home, RX pain and nausea meds and flomax. F/u Urology as needed.   PT verbalizes understanding kidney stones precautions. Plan filter all urine.   MDM    R flank pain c/w ureterolithiasis. UA reviewed - no UTI. IVFs. IV  fentanyl. VS and Nurisng notes reviewed.         Sunnie Nielsen, MD 09/11/12 [phone]

## 2012-09-10 MED ORDER — ONDANSETRON HCL 4 MG PO TABS: 4.0000 mg | ORAL_TABLET | Freq: Four times a day (QID) | ORAL | Status: DC

## 2012-09-10 MED ORDER — OXYCODONE-ACETAMINOPHEN 5-325 MG PO TABS: 2.0000 | ORAL_TABLET | ORAL | Status: DC | PRN

## 2012-09-10 MED ORDER — TAMSULOSIN HCL 0.4 MG PO CAPS: 0.4000 mg | ORAL_CAPSULE | Freq: Every day | ORAL | Status: DC

## 2012-11-18 ENCOUNTER — Encounter: Payer: Self-pay | Admitting: Family Medicine

## 2012-11-18 ENCOUNTER — Ambulatory Visit (INDEPENDENT_AMBULATORY_CARE_PROVIDER_SITE_OTHER): Payer: BC Managed Care – PPO | Admitting: Family Medicine

## 2012-11-18 VITALS — BP 130/82 | HR 81 | Temp 97.9°F | Wt 290.0 lb

## 2012-11-18 DIAGNOSIS — J069 Acute upper respiratory infection, unspecified: Secondary | ICD-10-CM

## 2012-11-18 MED ORDER — HYDROCODONE-HOMATROPINE 5-1.5 MG/5ML PO SYRP: 5.0000 mL | ORAL_SOLUTION | Freq: Three times a day (TID) | ORAL | Status: DC | PRN

## 2012-11-18 NOTE — Progress Notes (Signed)
Chief Complaint  Patient presents with  . Cough    mucus a yellow tint x couple weeks     HPI:  -started: about 2 weeks ago -symptoms:nasal congestion, cough - productive, a little sinus pain -denies:fever, SOB, NVD, tooth pain, strep or mono exposure -has tried: OTC cough medications -sick contacts: wife and kids with a cold -Hx of: hx of repeated URI   ROS: See pertinent positives and negatives per HPI.  Past Medical History  Diagnosis Date  . H/O: pneumonia   . Uric acid kidney stone     No family history on file.  History   Social History  . Marital Status: Married    Spouse Name: N/A    Number of Children: N/A  . Years of Education: N/A   Social History Main Topics  . Smoking status: Never Smoker   . Smokeless tobacco: None  . Alcohol Use: Yes  . Drug Use:   . Sexually Active:    Other Topics Concern  . None   Social History Narrative  . None    Current outpatient prescriptions:HYDROcodone-acetaminophen (NORCO) 5-325 MG per tablet, Take 1 tablet by mouth every 6 (six) hours as needed. Pain, Disp: , Rfl: ;  HYDROcodone-homatropine (HYCODAN) 5-1.5 MG/5ML syrup, Take 5 mLs by mouth every 8 (eight) hours as needed for cough., Disp: 120 mL, Rfl: 0;  omeprazole (PRILOSEC) 40 MG capsule, Take 40 mg by mouth daily.  , Disp: , Rfl:  ondansetron (ZOFRAN) 4 MG tablet, Take 1 tablet (4 mg total) by mouth every 6 (six) hours., Disp: 12 tablet, Rfl: 0;  oxyCODONE-acetaminophen (PERCOCET/ROXICET) 5-325 MG per tablet, Take 2 tablets by mouth every 4 (four) hours as needed for pain., Disp: 15 tablet, Rfl: 0;  Tamsulosin HCl (FLOMAX) 0.4 MG CAPS, Take 1 capsule (0.4 mg total) by mouth daily after supper., Disp: 30 capsule, Rfl: 0  EXAM:  Filed Vitals:   11/18/12 0857  BP: 130/82  Pulse: 81  Temp: 97.9 F (36.6 C)    There is no height on file to calculate BMI.  GENERAL: vitals reviewed and listed above, alert, oriented, appears well hydrated and in no acute  distress  HEENT: atraumatic, conjunttiva clear, no obvious abnormalities on inspection of external nose and ears, normal appearance of ear canals and TMs, clear nasal congestion, mild post oropharyngeal erythema with PND, no tonsillar edema or exudate, no sinus TTP  NECK: no obvious masses on inspection  LUNGS: clear to auscultation bilaterally, no wheezes, rales or rhonchi, good air movement  CV: HRRR, no peripheral edema  MS: moves all extremities without noticeable abnormality  PSYCH: pleasant and cooperative, no obvious depression or anxiety  ASSESSMENT AND PLAN:  Discussed the following assessment and plan:  1. Upper respiratory infection  HYDROcodone-homatropine (HYCODAN) 5-1.5 MG/5ML syrup   -likely viral given hx and improving, discussed abx incase of bacterial illness - pt would like to hold off on this. Cough medication (discussed risks) and supportive care. Return precautions. -Patient advised to return or notify a doctor immediately if symptoms worsen or persist or new concerns arise.  Patient Instructions  INSTRUCTIONS FOR UPPER RESPIRATORY INFECTION:  -plenty of rest and fluids  -nasal saline wash 2-3 times daily (use prepackaged nasal saline or bottled/distilled water if making your own)   -can use sinex nasal spray for drainage and nasal congestion - but do NOT use longer then 3-4 days  -can use tylenol or ibuprofen as directed for aches and sorethroat  -in the winter time,  using a humidifier at night is helpful (please follow cleaning instructions)  -if you are taking a cough medication - use only as directed, may also try a teaspoon of honey to coat the throat and throat lozenges  -for sore throat, salt water gargles can help  -follow up if you have fevers, facial pain, tooth pain, difficulty breathing or are worsening or not getting better in 5-7 days      Anapaula Severt, Jarrett Soho R.

## 2012-11-18 NOTE — Patient Instructions (Addendum)

## 2013-04-14 ENCOUNTER — Ambulatory Visit (INDEPENDENT_AMBULATORY_CARE_PROVIDER_SITE_OTHER): Payer: BC Managed Care – PPO | Admitting: Family Medicine

## 2013-04-14 VITALS — BP 120/84 | Temp 97.6°F | Wt 276.0 lb

## 2013-04-14 DIAGNOSIS — M109 Gout, unspecified: Secondary | ICD-10-CM

## 2013-04-14 MED ORDER — NAPROXEN 500 MG PO TABS: 500.0000 mg | ORAL_TABLET | Freq: Two times a day (BID) | ORAL | Status: DC

## 2013-04-14 NOTE — Patient Instructions (Signed)
Gout Gout is an inflammatory condition (arthritis) caused by a buildup of uric acid crystals in the joints. Uric acid is a chemical that is normally present in the blood. Under some circumstances, uric acid can form into crystals in your joints. This causes joint redness, soreness, and swelling (inflammation). Repeat attacks are common. Over time, uric acid crystals can form into masses (tophi) near a joint, causing disfigurement. Gout is treatable and often preventable. CAUSES  The disease begins with elevated levels of uric acid in the blood. Uric acid is produced by your body when it breaks down a naturally found substance called purines. This also happens when you eat certain foods such as meats and fish. Causes of an elevated uric acid level include:  Being passed down from parent to child (heredity).  Diseases that cause increased uric acid production (obesity, psoriasis, some cancers).  Excessive alcohol use.  Diet, especially diets rich in meat and seafood.  Medicines, including certain cancer-fighting drugs (chemotherapy), diuretics, and aspirin.  Chronic kidney disease. The kidneys are no longer able to remove uric acid well.  Problems with metabolism. Conditions strongly associated with gout include:  Obesity.  High blood pressure.  High cholesterol.  Diabetes. Not everyone with elevated uric acid levels gets gout. It is not understood why some people get gout and others do not. Surgery, joint injury, and eating too much of certain foods are some of the factors that can lead to gout. SYMPTOMS   An attack of gout comes on quickly. It causes intense pain with redness, swelling, and warmth in a joint.  Fever can occur.  Often, only one joint is involved. Certain joints are more commonly involved:  Base of the big toe.  Knee.  Ankle.  Wrist.  Finger. Without treatment, an attack usually goes away in a few days to weeks. Between attacks, you usually will not have  symptoms, which is different from many other forms of arthritis. DIAGNOSIS  Your caregiver will suspect gout based on your symptoms and exam.  TREATMENT  There are 2 phases to gout treatment: treating the sudden onset (acute) attack and preventing attacks (prophylaxis). Treatment of an Acute Attack  Medicines are used. These include anti-inflammatory medicines or steroid medicines.  An injection of steroid medicine into the affected joint is sometimes necessary.  The painful joint is rested. Movement can worsen the arthritis.  You may use warm or cold treatments on painful joints, depending which works best for you.  Discuss the use of coffee, vitamin C, or cherries with your caregiver. These may be helpful treatment options. Treatment to Prevent Attacks After the acute attack subsides, your caregiver may advise prophylactic medicine. These medicines either help your kidneys eliminate uric acid from your body or decrease your uric acid production. You may need to stay on these medicines for a very long time. The early phase of treatment with prophylactic medicine can be associated with an increase in acute gout attacks. For this reason, during the first few months of treatment, your caregiver may also advise you to take medicines usually used for acute gout treatment. Be sure you understand your caregiver's directions. You should also discuss dietary treatment with your caregiver. Certain foods such as meats and fish can increase uric acid levels. Other foods such as dairy can decrease levels. Your caregiver can give you a list of foods to avoid. HOME CARE INSTRUCTIONS   Do not take aspirin to relieve pain. This raises uric acid levels.  Only take over-the-counter or  prescription medicines for pain, discomfort, or fever as directed by your caregiver.  Rest the joint as much as possible. When in bed, keep sheets and blankets off painful areas.  Keep the affected joint raised  (elevated).  Use crutches if the painful joint is in your leg.  Drink enough water and fluids to keep your urine clear or pale yellow. This helps your body get rid of uric acid. Do not drink alcoholic beverages. They slow the passage of uric acid.  Follow your caregiver's dietary instructions. Pay careful attention to the amount of protein you eat. Your daily diet should emphasize fruits, vegetables, whole grains, and fat-free or low-fat milk products.  Maintain a healthy body weight. SEEK MEDICAL CARE IF:   You have an oral temperature above 102 F (38.9 C).  You develop diarrhea, vomiting, or any side effects from medicines.  You do not feel better in 24 hours, or you are getting worse. SEEK IMMEDIATE MEDICAL CARE IF:   Your joint becomes suddenly more tender and you have:  Chills.  An oral temperature above 102 F (38.9 C), not controlled by medicine. MAKE SURE YOU:   Understand these instructions.  Will watch your condition.  Will get help right away if you are not doing well or get worse. Document Released: 10/26/2000 Document Revised: 01/21/2012 Document Reviewed: 02/06/2010 [facility] Patient Information 2014 [place], [place].

## 2013-04-14 NOTE — Progress Notes (Signed)
Chief Complaint  Patient presents with  . Gout    right foot started getting sore on Friday     HPI:  Acute visit for "gout attack": -elevated urc acid in the past on review of labs -started 4 days ago, actually feeling a little better today -R MTP joint - acute swollen, tender, red -denies: fevers, chills, vomiting, malaise -reports hx of gout flares but has been several years -has been taking 600mg  ibuprfen 2-3 times daily and doing ice paces   ROS: See pertinent positives and negatives per HPI.  Past Medical History  Diagnosis Date  . H/O: pneumonia   . Uric acid kidney stone     No family history on file.  History   Social History  . Marital Status: Married    Spouse Name: N/A    Number of Children: N/A  . Years of Education: N/A   Social History Main Topics  . Smoking status: Never Smoker   . Smokeless tobacco: Not on file  . Alcohol Use: Yes  . Drug Use:   . Sexually Active:    Other Topics Concern  . Not on file   Social History Narrative  . No narrative on file    Current outpatient prescriptions:omeprazole (PRILOSEC) 40 MG capsule, Take 40 mg by mouth daily.  , Disp: , Rfl: ;  HYDROcodone-acetaminophen (NORCO) 5-325 MG per tablet, Take 1 tablet by mouth every 6 (six) hours as needed. Pain, Disp: , Rfl: ;  HYDROcodone-homatropine (HYCODAN) 5-1.5 MG/5ML syrup, Take 5 mLs by mouth every 8 (eight) hours as needed for cough., Disp: 120 mL, Rfl: 0 naproxen (NAPROSYN) 500 MG tablet, Take 1 tablet (500 mg total) by mouth 2 (two) times daily with a meal., Disp: 30 tablet, Rfl: 0;  ondansetron (ZOFRAN) 4 MG tablet, Take 1 tablet (4 mg total) by mouth every 6 (six) hours., Disp: 12 tablet, Rfl: 0;  oxyCODONE-acetaminophen (PERCOCET/ROXICET) 5-325 MG per tablet, Take 2 tablets by mouth every 4 (four) hours as needed for pain., Disp: 15 tablet, Rfl: 0 Tamsulosin HCl (FLOMAX) 0.4 MG CAPS, Take 1 capsule (0.4 mg total) by mouth daily after supper., Disp: 30 capsule, Rfl:  0  EXAM:  Filed Vitals:   04/14/13 1345  BP: 120/84  Temp: 97.6 F (36.4 C)    Body mass index is 34.03 kg/(m^2).  GENERAL: vitals reviewed and listed above, alert, oriented, appears well hydrated and in no acute distress  HEENT: atraumatic, conjunttiva clear, no obvious abnormalities on inspection of external nose and ears  NECK: no obvious masses on inspection  MS: moves all extremities without noticeable abnormality, mildly swollen, red, hot, tender MTP joint  PSYCH: pleasant and cooperative, no obvious depression or anxiety  ASSESSMENT AND PLAN:  Discussed the following assessment and plan:  Gout - Plan: naproxen (NAPROSYN) 500 MG tablet  -recurrent gout flare, discussed implications and tx - risks discussed, no contraindications to NSAIDS -discussed other potential etiologies (though very unlikely) and return precautions -Patient advised to return or notify a doctor immediately if symptoms worsen or persist or new concerns arise.  There are no Patient Instructions on file for this visit.   Kriste Basque R.

## 2013-07-04 ENCOUNTER — Encounter (HOSPITAL_COMMUNITY): Payer: Self-pay

## 2013-07-04 ENCOUNTER — Emergency Department (HOSPITAL_COMMUNITY)
Admission: EM | Admit: 2013-07-04 | Discharge: 2013-07-04 | Disposition: A | Discharge status: Home / Self Care | Payer: BC Managed Care – PPO | Attending: Emergency Medicine | Admitting: Emergency Medicine

## 2013-07-04 ENCOUNTER — Emergency Department (HOSPITAL_COMMUNITY)
Admission: EM | Admit: 2013-07-04 | Discharge: 2013-07-04 | Discharge status: Against Medical Advice | Payer: BC Managed Care – PPO

## 2013-07-04 DIAGNOSIS — Z791 Long term (current) use of non-steroidal anti-inflammatories (NSAID): Secondary | ICD-10-CM | POA: Insufficient documentation

## 2013-07-04 DIAGNOSIS — R112 Nausea with vomiting, unspecified: Secondary | ICD-10-CM | POA: Insufficient documentation

## 2013-07-04 DIAGNOSIS — Z8701 Personal history of pneumonia (recurrent): Secondary | ICD-10-CM | POA: Insufficient documentation

## 2013-07-04 DIAGNOSIS — R109 Unspecified abdominal pain: Secondary | ICD-10-CM

## 2013-07-04 DIAGNOSIS — Z79899 Other long term (current) drug therapy: Secondary | ICD-10-CM | POA: Insufficient documentation

## 2013-07-04 DIAGNOSIS — R319 Hematuria, unspecified: Secondary | ICD-10-CM

## 2013-07-04 DIAGNOSIS — Z87442 Personal history of urinary calculi: Secondary | ICD-10-CM | POA: Insufficient documentation

## 2013-07-04 LAB — CBC WITH DIFFERENTIAL/PLATELET
Basophils Relative: 0 % (ref 0–1)
HCT: 41 % (ref 39.0–52.0)
Hemoglobin: 14.5 g/dL (ref 13.0–17.0)
Lymphocytes Relative: 9 % — ABNORMAL LOW (ref 12–46)
Lymphs Abs: 1.1 10*3/uL (ref 0.7–4.0)
Monocytes Absolute: 0.7 10*3/uL (ref 0.1–1.0)
Monocytes Relative: 6 % (ref 3–12)
Neutro Abs: 10 10*3/uL — ABNORMAL HIGH (ref 1.7–7.7)
Neutrophils Relative %: 84 % — ABNORMAL HIGH (ref 43–77)
RBC: 4.55 MIL/uL (ref 4.22–5.81)
WBC: 11.9 10*3/uL — ABNORMAL HIGH (ref 4.0–10.5)

## 2013-07-04 LAB — URINALYSIS, ROUTINE W REFLEX MICROSCOPIC
Glucose, UA: NEGATIVE mg/dL
Specific Gravity, Urine: 1.029 (ref 1.005–1.030)
Urobilinogen, UA: 0.2 mg/dL (ref 0.0–1.0)
pH: 5 (ref 5.0–8.0)

## 2013-07-04 LAB — BASIC METABOLIC PANEL
Chloride: 99 mEq/L (ref 96–112)
GFR calc Af Amer: >90 mL/min (ref >90)
GFR calc non Af Amer: 82 mL/min — ABNORMAL LOW (ref >90)
Potassium: 3.9 mEq/L (ref 3.5–5.1)
Sodium: 134 mEq/L — ABNORMAL LOW (ref 135–145)

## 2013-07-04 LAB — URINE MICROSCOPIC-ADD ON

## 2013-07-04 MED ORDER — KETOROLAC TROMETHAMINE 30 MG/ML IJ SOLN
15.0000 mg | Freq: Once | INTRAMUSCULAR | Status: AC
  Administered 2013-07-04: 15 mg via INTRAVENOUS
  Filled 2013-07-04: qty 1

## 2013-07-04 MED ORDER — OXYCODONE-ACETAMINOPHEN 5-325 MG PO TABS: 1.0000 | ORAL_TABLET | ORAL | Status: DC | PRN

## 2013-07-04 MED ORDER — HYDROMORPHONE HCL PF 1 MG/ML IJ SOLN
1.0000 mg | Freq: Once | INTRAMUSCULAR | Status: AC
  Administered 2013-07-04: 1 mg via INTRAVENOUS
  Filled 2013-07-04: qty 1

## 2013-07-04 MED ORDER — SODIUM CHLORIDE 0.9 % IV SOLN
1000.0000 mL | Freq: Once | INTRAVENOUS | Status: AC
  Administered 2013-07-04: 1000 mL via INTRAVENOUS

## 2013-07-04 MED ORDER — SODIUM CHLORIDE 0.9 % IV BOLUS (SEPSIS)
1000.0000 mL | Freq: Once | INTRAVENOUS | Status: AC
  Administered 2013-07-04: 1000 mL via INTRAVENOUS

## 2013-07-04 MED ORDER — ONDANSETRON HCL 8 MG PO TABS: 8.0000 mg | ORAL_TABLET | Freq: Three times a day (TID) | ORAL | Status: DC | PRN

## 2013-07-04 NOTE — ED Notes (Signed)
Patient having emesis

## 2013-07-04 NOTE — ED Notes (Signed)
Patient has hx of kidney stones. Patient began having abdominal pain  While at work. Came home and took naproxen and flomax without relief. Vomited shortly after taking. This is pt's 3rd episode of kidney stones in the past year.

## 2013-07-04 NOTE — ED Provider Notes (Signed)
CSN: [id number]     Arrival date & time 07/04/13  1259 History     First MD Initiated Contact with Patient 07/04/13 1303     Chief Complaint  Patient presents with  . Abdominal Pain    Patient is a 47 y.o. male presenting with abdominal pain. The history is provided by the patient.  Abdominal Pain Pain location:  R flank Pain quality: sharp   Pain severity:  Severe Onset quality:  Sudden Duration: several hours. Timing:  Constant Progression:  Worsening Chronicity:  Recurrent Relieved by:  Nothing Worsened by:  Nothing tried Associated symptoms: nausea and vomiting   Associated symptoms: no chest pain, no fever and no shortness of breath   pt reports onset of right flank pain suddenly this morning.  He reports similar to previous episodes of kidney stones.  He reports he has never had surgery for his kidney stones previously No cp/sob No focal weakness   Past Medical History  Diagnosis Date  . H/O: pneumonia   . Uric acid kidney stone    History reviewed. No pertinent past surgical history. No family history on file. History  Substance Use Topics  . Smoking status: Never Smoker   . Smokeless tobacco: Never Used  . Alcohol Use: 1.2 oz/week    2 Cans of beer per week     Comment: daily    Review of Systems  Constitutional: Negative for fever.  Respiratory: Negative for shortness of breath.   Cardiovascular: Negative for chest pain.  Gastrointestinal: Positive for nausea, vomiting and abdominal pain.  Genitourinary: Positive for flank pain. Negative for testicular pain.  Neurological: Negative for weakness.  All other systems reviewed and are negative.    Allergies  Review of patient's allergies indicates no known allergies.  Home Medications   Current Outpatient Rx  Name  Route  Sig  Dispense  Refill  . cyclobenzaprine (FLEXERIL) 10 MG tablet   Oral   Take 10 mg by mouth 3 (three) times daily as needed for muscle spasms.         Marland Kitchen ibuprofen  (ADVIL,MOTRIN) 200 MG tablet   Oral   Take 200 mg by mouth every 6 (six) hours as needed for pain.         . naproxen (NAPROSYN) 500 MG tablet   Oral   Take 1 tablet (500 mg total) by mouth 2 (two) times daily with a meal.   30 tablet   0   . ondansetron (ZOFRAN) 4 MG tablet   Oral   Take 1 tablet (4 mg total) by mouth every 6 (six) hours.   12 tablet   0   . Tamsulosin HCl (FLOMAX) 0.4 MG CAPS   Oral   Take 1 capsule (0.4 mg total) by mouth daily after supper.   30 capsule   0    BP 155/83  Pulse 54  Temp(Src) 97.9 F (36.6 C) (Oral)  Resp 28  SpO2 98% BP 131/78  Pulse 53  Temp(Src) 97.9 F (36.6 C) (Oral)  Resp 18  SpO2 94%  Physical Exam CONSTITUTIONAL: Well developed/well nourished HEAD: Normocephalic/atraumatic EYES: EOMI/PERRL ENMT: Mucous membranes moist NECK: supple no meningeal signs SPINE:entire spine nontender CV: S1/S2 noted, no murmurs/rubs/gallops noted LUNGS: Lungs are clear to auscultation bilaterally, no apparent distress ABDOMEN: soft, nontender, no rebound or guarding GU:no cva tenderness. No inguinal hernia.  No testicular tenderness.  Family present at patient request NEURO: Pt is awake/alert, moves all extremitiesx4 EXTREMITIES: pulses normal, full ROM SKIN:  warm, color normal PSYCH: no abnormalities of mood noted  ED Course   Procedures  Labs Reviewed  BASIC METABOLIC PANEL - Abnormal; Notable for the following:    Sodium 134 (*)    Glucose, Bld 130 (*)    GFR calc non Af Amer 82 (*)    All other components within normal limits  CBC WITH DIFFERENTIAL - Abnormal; Notable for the following:    WBC 11.9 (*)    Neutrophils Relative % 84 (*)    Neutro Abs 10.0 (*)    Lymphocytes Relative 9 (*)    All other components within normal limits  URINALYSIS, ROUTINE W REFLEX MICROSCOPIC  2:33 PM Pt reports flank pain c/w previous ureteral colic.  He reports he has never required intervention.  He is afebrile currently.  He is already  improving from one dose of pain meds.   Labs pending Will defer imaging for now 3:26 PM Pt feeling improved We agree to defer imaging and will refer to urology He has flomax at home, told him to he could restart for the next 2-3 nights He feels he needs "more fluids" to "flush the stone" out.  After this he would feel comfortable with d/c home He is well appearing/stable and no distress, doubt other acute abdominal process at this time  MDM  Nursing notes including past medical history and social history reviewed and considered in documentation Labs/vital reviewed and considered   Joya Gaskins, MD 07/04/13 1527

## 2014-08-19 ENCOUNTER — Ambulatory Visit (INDEPENDENT_AMBULATORY_CARE_PROVIDER_SITE_OTHER): Payer: BC Managed Care – PPO | Admitting: Internal Medicine

## 2014-08-19 ENCOUNTER — Encounter: Payer: Self-pay | Admitting: Internal Medicine

## 2014-08-19 ENCOUNTER — Other Ambulatory Visit (INDEPENDENT_AMBULATORY_CARE_PROVIDER_SITE_OTHER): Payer: BC Managed Care – PPO

## 2014-08-19 VITALS — BP 142/92 | HR 76 | Temp 98.2°F | Resp 16 | Ht 76.0 in | Wt 300.6 lb

## 2014-08-19 DIAGNOSIS — M545 Low back pain, unspecified: Secondary | ICD-10-CM

## 2014-08-19 DIAGNOSIS — Z23 Encounter for immunization: Secondary | ICD-10-CM

## 2014-08-19 DIAGNOSIS — Z0000 Encounter for general adult medical examination without abnormal findings: Secondary | ICD-10-CM

## 2014-08-19 DIAGNOSIS — G4733 Obstructive sleep apnea (adult) (pediatric): Secondary | ICD-10-CM

## 2014-08-19 LAB — BASIC METABOLIC PANEL
BUN: 10 mg/dL (ref 6–23)
CALCIUM: 9.4 mg/dL (ref 8.4–10.5)
CO2: 30 mEq/L (ref 19–32)
Chloride: 103 mEq/L (ref 96–112)
Creatinine, Ser: 1.1 mg/dL (ref 0.4–1.5)
GFR: 80.39 mL/min (ref >60.00)
Glucose, Bld: 105 mg/dL — ABNORMAL HIGH (ref 70–99)
POTASSIUM: 4.2 meq/L (ref 3.5–5.1)
SODIUM: 140 meq/L (ref 135–145)

## 2014-08-19 LAB — LIPID PANEL
CHOL/HDL RATIO: 7
Cholesterol: 251 mg/dL — ABNORMAL HIGH (ref 0–200)
HDL: 35.5 mg/dL — ABNORMAL LOW (ref >39.00)
NONHDL: 215.5
TRIGLYCERIDES: 247 mg/dL — AB (ref 0.0–149.0)
VLDL: 49.4 mg/dL — AB (ref 0.0–40.0)

## 2014-08-19 LAB — LDL CHOLESTEROL, DIRECT: LDL DIRECT: 173.9 mg/dL

## 2014-08-19 LAB — HEMOGLOBIN A1C: Hgb A1c MFr Bld: 5.8 % (ref 4.6–6.5)

## 2014-08-19 NOTE — Progress Notes (Signed)
Pre visit review using our clinic review tool, if applicable. No additional management support is needed unless otherwise documented below in the visit note. 

## 2014-08-19 NOTE — Patient Instructions (Signed)
We will check your blood work today and call you back with those results.   Keep working on exercising 4-5 days per week. It would be good if you could lose about 5-10 pounds. Think about serving sizes and portions for a way to reduce calories without having to give up foods that you like.   Serving Sizes What we call a serving size today is larger than it was in the past. A 1950s fast-food burger contained little more than 1 oz of meat, and a soft drink was 8 oz (1 cup). Today, a "quarter pounder" burger is at least 4 times that amount, and a 32 or 64 oz drink is not uncommon. A possible guide for eating when trying to lose weight is to eat about half as much as you normally do. Some estimates of serving sizes are:  1 Dairy serving:Individual container of yogurt (8 oz) or piece of cheese the size of your thumb (1 oz).  1 Grain serving: 1 slice of bread or  cup pasta.  1 Meat serving: The size of a deck of cards (3 oz).  1 Fruit serving: cup canned fruit or 1 medium fruit.  1 Vegetable serving:  cup of cooked or canned vegetables.  1 Fat serving:The size of 4 stacked dimes. Experts suggest spending 1 or 2 days measuring food portions you commonly eat. This will give you better practice at estimating serving sizes, and will also show whether you are eating an appropriate amount of food to meet your weight goals. If you find that you are eating more than you thought, try measuring your food for a few days so you can "reprogram" yourself to learn what makes a healthy portion for you. SUGGESTIONS FOR CONTROL  In restaurants, share entrees, or ask the waiter to put half the entre in a box or bag before you even touch it.  Order lunch-sized portions. Many restaurants serve 4 to 6 oz of meat at lunch, compared with 8 to 10 oz at dinner.  Split dessert or skip it all together. Have a piece of fruit when you get home.  At home, use smaller plates and bowls. It will look as if you are eating  more.  Plate your food in the kitchen rather than serving it "family style" at the table.  Wait 20 to 30 minutes before taking seconds. This is how long it takes your brain to recognize that you are full.  Check food labels for serving sizes. Eat 1 serving only.  Use measuring cups and spoons to see proper serving sizes.  Buy smaller packages of candy, popcorn, and snacks.  Avoid eating directly out of the bag or carton.  While eating half as much, exercise twice as much. Park further away from the mall, take the stairs instead of the escalator, and walk around your block. Losing weight is a slow, difficult process. It takes long-lasting lifestyle changes. You can make gradual changes over time so they become habits. Look to friends and family to support the healthy changes you are making. Avoid fad diets since they are often only temporary weight loss solutions. Document Released: 07/28/2003 Document Revised: 01/21/2012 Document Reviewed: 01/26/2014 [facility] Patient Information 2015 [place], [place]. This information is not intended to replace advice given to you by your health care provider. Make sure you discuss any questions you have with your health care provider.

## 2014-08-20 DIAGNOSIS — Z0000 Encounter for general adult medical examination without abnormal findings: Secondary | ICD-10-CM | POA: Insufficient documentation

## 2014-08-20 NOTE — Assessment & Plan Note (Addendum)
Flu shot given a visit. Up-to-date on other preventative care. He will work on exercise and diet.

## 2014-08-20 NOTE — Assessment & Plan Note (Signed)
He does have off-and-on back pain, no medication regard at this time. He does use OTC analgesics when flares of his back pain.

## 2014-08-20 NOTE — Assessment & Plan Note (Signed)
Patient does not use CPAP, will review records to see if he has had sleep study.

## 2014-08-20 NOTE — Progress Notes (Signed)
   Subjective:    Patient ID: Antonio Mccullough, male    DOB: Feb 16, 1966, 48 y.o.   MRN: [id number]  HPI The patient is a 48 year old man who is coming in today to establish care. He has no significant past history. He denies chest pain, shortness breath, constipation, nausea, vomiting, diarrhea, abdominal pain, leg swelling, palpitations. He is not exercising as much as he should and he knows that his diet is not ideal.  Review of Systems  Constitutional: Negative for fever, activity change, appetite change and fatigue.  HENT: Negative.   Eyes: Negative.   Respiratory: Negative for cough, chest tightness, shortness of breath and wheezing.   Cardiovascular: Negative for chest pain, palpitations and leg swelling.  Gastrointestinal: Negative for abdominal pain, diarrhea, constipation and abdominal distention.  Genitourinary: Negative.   Musculoskeletal: Negative for arthralgias, back pain, gait problem and myalgias.  Skin: Negative.   Allergic/Immunologic: Negative.   Neurological: Negative.       Objective:   Physical Exam  Constitutional: He appears well-developed and well-nourished.  Overweight  HENT:  Head: Normocephalic and atraumatic.  Eyes: EOM are normal.  Neck: Normal range of motion.  Cardiovascular: Normal rate and regular rhythm.   No murmur heard. Pulmonary/Chest: Effort normal and breath sounds normal. No respiratory distress. He has no wheezes. He has no rales.  Abdominal: Soft. Bowel sounds are normal. He exhibits no distension. There is no tenderness. There is no rebound.  Musculoskeletal: He exhibits no tenderness.  Neurological: He is alert. Coordination normal.  Skin: Skin is warm and dry.   Filed Vitals:   08/19/14 1353  BP: 142/92  Pulse: 76  Temp: 98.2 F (36.8 C)  TempSrc: Oral  Resp: 16  Height: 6\' 4"  (1.93 m)  Weight: 300 lb 9.6 oz (136.351 kg)  SpO2: 98%      Assessment & Plan:  Flu shot given today.

## 2015-03-24 ENCOUNTER — Encounter (HOSPITAL_COMMUNITY): Payer: Self-pay | Admitting: Emergency Medicine

## 2015-03-24 ENCOUNTER — Telehealth: Payer: Self-pay | Admitting: *Deleted

## 2015-03-24 ENCOUNTER — Telehealth: Payer: Self-pay | Admitting: Internal Medicine

## 2015-03-24 ENCOUNTER — Emergency Department (HOSPITAL_COMMUNITY): Payer: BLUE CROSS/BLUE SHIELD

## 2015-03-24 ENCOUNTER — Emergency Department (HOSPITAL_COMMUNITY)
Admission: EM | Admit: 2015-03-24 | Discharge: 2015-03-24 | Disposition: A | Discharge status: Home / Self Care | Payer: BLUE CROSS/BLUE SHIELD | Attending: Emergency Medicine | Admitting: Emergency Medicine

## 2015-03-24 DIAGNOSIS — Z791 Long term (current) use of non-steroidal anti-inflammatories (NSAID): Secondary | ICD-10-CM | POA: Insufficient documentation

## 2015-03-24 DIAGNOSIS — M549 Dorsalgia, unspecified: Secondary | ICD-10-CM

## 2015-03-24 DIAGNOSIS — M5136 Other intervertebral disc degeneration, lumbar region: Secondary | ICD-10-CM | POA: Insufficient documentation

## 2015-03-24 DIAGNOSIS — Z8701 Personal history of pneumonia (recurrent): Secondary | ICD-10-CM | POA: Insufficient documentation

## 2015-03-24 DIAGNOSIS — Z87442 Personal history of urinary calculi: Secondary | ICD-10-CM | POA: Diagnosis not present

## 2015-03-24 DIAGNOSIS — M545 Low back pain: Secondary | ICD-10-CM | POA: Diagnosis present

## 2015-03-24 DIAGNOSIS — M519 Unspecified thoracic, thoracolumbar and lumbosacral intervertebral disc disorder: Secondary | ICD-10-CM

## 2015-03-24 HISTORY — DX: Unspecified thoracic, thoracolumbar and lumbosacral intervertebral disc disorder: M51.9

## 2015-03-24 LAB — BASIC METABOLIC PANEL
Anion gap: 6 (ref 5–15)
BUN: 15 mg/dL (ref 6–20)
CALCIUM: 9.1 mg/dL (ref 8.9–10.3)
CO2: 28 mmol/L (ref 22–32)
CREATININE: 0.97 mg/dL (ref 0.61–1.24)
Chloride: 104 mmol/L (ref 101–111)
GFR calc Af Amer: >60 mL/min (ref >60)
GLUCOSE: 93 mg/dL (ref 65–99)
Potassium: 4.4 mmol/L (ref 3.5–5.1)
SODIUM: 138 mmol/L (ref 135–145)

## 2015-03-24 LAB — URINALYSIS, ROUTINE W REFLEX MICROSCOPIC
Bilirubin Urine: NEGATIVE
Glucose, UA: NEGATIVE mg/dL
Hgb urine dipstick: NEGATIVE
KETONES UR: NEGATIVE mg/dL
LEUKOCYTES UA: NEGATIVE
NITRITE: NEGATIVE
PH: 6 (ref 5.0–8.0)
PROTEIN: NEGATIVE mg/dL
Specific Gravity, Urine: 1.015 (ref 1.005–1.030)
Urobilinogen, UA: 0.2 mg/dL (ref 0.0–1.0)

## 2015-03-24 MED ORDER — CYCLOBENZAPRINE HCL 10 MG PO TABS: 5.0000 mg | ORAL_TABLET | Freq: Every day | ORAL | Status: DC

## 2015-03-24 MED ORDER — HYDROCODONE-ACETAMINOPHEN 5-325 MG PO TABS
1.0000 | ORAL_TABLET | Freq: Once | ORAL | Status: AC
Start: 2015-03-24 — End: 2015-03-24
  Administered 2015-03-24: 1 via ORAL
  Filled 2015-03-24: qty 1

## 2015-03-24 MED ORDER — HYDROCODONE-ACETAMINOPHEN 5-325 MG PO TABS: 1.0000 | ORAL_TABLET | Freq: Four times a day (QID) | ORAL | Status: DC | PRN

## 2015-03-24 MED ORDER — CYCLOBENZAPRINE HCL 10 MG PO TABS
10.0000 mg | ORAL_TABLET | Freq: Once | ORAL | Status: AC
  Administered 2015-03-24: 10 mg via ORAL
  Filled 2015-03-24: qty 1

## 2015-03-24 NOTE — ED Notes (Signed)
Attempted to ambulate pt. Pt sat up and then refused to go any farther until pt. Received a muscle relaxant.

## 2015-03-24 NOTE — Discharge Instructions (Signed)
Please call your doctor for a followup appointment within 24-48 hours. When you talk to your doctor please let them know that you were seen in the emergency department and have them acquire all of your records so that they can discuss the findings with you and formulate a treatment plan to fully care for your new and ongoing problems. Please follow-up with your primary care provider Please follow-up with orthopedics Please take medications as prescribed Please avoid any physical or strenuous activity Please apply heat and massage with icy hot ointment Please take medications as prescribed - while on pain medications there is to be no drinking alcohol, driving, operating any heavy machinery. If extra please dispose in a proper manner. Please do not take any extra Tylenol with this medication for this can lead to Tylenol overdose and liver issues.  Please continue to monitor symptoms closely and if symptoms are to worsen or change (fever greater than 101, chills, sweating, nausea, vomiting, chest pain, shortness of breathe, difficulty breathing, weakness, numbness, tingling, worsening or changes to pain pattern, fall, injury, loss of sensation, inability to control urine or bowel movements, changes to pain pattern, worsening or changes to muscle spasms, issues with urination, pain with urination, stomach pain, dragging of the leg) please report back to the Emergency Department immediately.   Back Pain, Adult Low back pain is very common. About 1 in 5 people have back pain.The cause of low back pain is rarely dangerous. The pain often gets better over time.About half of people with a sudden onset of back pain feel better in just 2 weeks. About 8 in 10 people feel better by 6 weeks.  CAUSES Some common causes of back pain include:  Strain of the muscles or ligaments supporting the spine.  Wear and tear (degeneration) of the spinal discs.  Arthritis.  Direct injury to the back. DIAGNOSIS Most of  the time, the direct cause of low back pain is not known.However, back pain can be treated effectively even when the exact cause of the pain is unknown.Answering your caregiver's questions about your overall health and symptoms is one of the most accurate ways to make sure the cause of your pain is not dangerous. If your caregiver needs more information, he or she may order lab work or imaging tests (X-rays or MRIs).However, even if imaging tests show changes in your back, this usually does not require surgery. HOME CARE INSTRUCTIONS For many people, back pain returns.Since low back pain is rarely dangerous, it is often a condition that people can learn to [facility] their own.   Remain active. It is stressful on the back to sit or stand in one place. Do not sit, drive, or stand in one place for more than 30 minutes at a time. Take short walks on level surfaces as soon as pain allows.Try to increase the length of time you walk each day.  Do not stay in bed.Resting more than 1 or 2 days can delay your recovery.  Do not avoid exercise or work.Your body is made to move.It is not dangerous to be active, even though your back may hurt.Your back will likely heal faster if you return to being active before your pain is gone.  Pay attention to your body when you bend and lift. Many people have less discomfortwhen lifting if they bend their knees, keep the load close to their bodies,and avoid twisting. Often, the most comfortable positions are those that put less stress on your recovering back.  Find a  comfortable position to sleep. Use a firm mattress and lie on your side with your knees slightly bent. If you lie on your back, put a pillow under your knees.  Only take over-the-counter or prescription medicines as directed by your caregiver. Over-the-counter medicines to reduce pain and inflammation are often the most helpful.Your caregiver may prescribe muscle relaxant drugs.These medicines help  dull your pain so you can more quickly return to your normal activities and healthy exercise.  Put ice on the injured area.  Put ice in a plastic bag.  Place a towel between your skin and the bag.  Leave the ice on for 15-20 minutes, 03-04 times a day for the first 2 to 3 days. After that, ice and heat may be alternated to reduce pain and spasms.  Ask your caregiver about trying back exercises and gentle massage. This may be of some benefit.  Avoid feeling anxious or stressed.Stress increases muscle tension and can worsen back pain.It is important to recognize when you are anxious or stressed and learn ways to manage it.Exercise is a great option. SEEK MEDICAL CARE IF:  You have pain that is not relieved with rest or medicine.  You have pain that does not improve in 1 week.  You have new symptoms.  You are generally not feeling well. SEEK IMMEDIATE MEDICAL CARE IF:   You have pain that radiates from your back into your legs.  You develop new bowel or bladder control problems.  You have unusual weakness or numbness in your arms or legs.  You develop nausea or vomiting.  You develop abdominal pain.  You feel faint. Document Released: 10/29/2005 Document Revised: 04/29/2012 Document Reviewed: 03/02/2014 [employer] Patient Information 2015 [place] [place]. This information is not intended to replace advice given to you by your health care provider. Make sure you discuss any questions you have with your health care provider.

## 2015-03-24 NOTE — ED Notes (Signed)
Attempted to ambulate pt again. Pt rolled from side to side and then asked RN to leave and give him a few more minutes.

## 2015-03-24 NOTE — ED Notes (Signed)
Pt c/o severe low back pain and spasms starting 6 days ago.Pain increased yesterday.  Pt treated with naproxen and motrin. Pt unable to ambulate due to pain. Spasms isolated to r/lower back. Denies radiating pain. Denies NVD

## 2015-03-24 NOTE — ED Provider Notes (Signed)
CSN: [id number]     Arrival date & time 03/24/15  1121 History  This chart was scribed for non-physician practitioner, Raymon Mutton PA, working with Tilden Fossa, MD, by Tanda Rockers, ED Scribe. This patient was seen in room WTR6/WTR6 and the patient's care was started at 1:01 PM.    Chief Complaint  Patient presents with  . Back Pain    back spams x 6 days. Increased in last 24 hours   The history is provided by the patient. No language interpreter was used.     HPI Comments: Antonio Mccullough is a 49 y.o. male with hx slipped disc who presents to the Emergency Department complaining of lower right back spasms that began Friday, 5/6 (1 week ago), worsening yesterday afternoon. He reports that he had a cold 1 week ago with coughing. Pt was in the shower on Friday and doubled over while coughing, which brought on the spasms. Pain has been constant since yesterday. Pt describes it as a tight, squeezing sensation. The pain is exacerbated with movement and turns into a sharp, shooting pain.  He states that he is unable to ambulate due to the pain. Pt has taken Naproxen and Motrin without relief. Pt is on his feet most of the time for work and does occasional heavy lifting.He has hx of slipped discs 4 times in the last 20 years. He mentions that he has had these symptoms in the past which usually turn into slipped discs. He has seen orthopedists and gone through physical therapy in the past.  No pain radiation, falls, injuries, fever, chills, neck pain, neck stiffness, chest pain, shortness of breath, abdominal pain, nausea, vomiting, urinary or bowel incontinence, numbness or tingling, loss of sensation, or any other symptoms. Denied IV drug abuse  PCP - Dorise Hiss   Past Medical History  Diagnosis Date  . H/O: pneumonia   . Uric acid kidney stone   . Disc disorder    Past Surgical History  Procedure Laterality Date  . Wisdom tooth extraction     History reviewed. No pertinent family  history. History  Substance Use Topics  . Smoking status: Never Smoker   . Smokeless tobacco: Never Used  . Alcohol Use: 1.2 oz/week    2 Cans of beer per week     Comment: daily    Review of Systems  Constitutional: Negative for fever and chills.  Respiratory: Negative for shortness of breath.   Cardiovascular: Negative for chest pain.  Gastrointestinal: Negative for nausea, vomiting and abdominal pain.       Negative for bowel incontinence.   Genitourinary:       Negative for urinary incontinence.   Musculoskeletal: Positive for back pain. Negative for myalgias, arthralgias, neck pain and neck stiffness.       Positive for lower right back spasms.   Neurological: Negative for weakness and numbness.      Allergies  Review of patient's allergies indicates no known allergies.  Home Medications   Prior to Admission medications   Medication Sig Start Date End Date Taking? Authorizing Provider  ibuprofen (ADVIL,MOTRIN) 800 MG tablet Take 800 mg by mouth every 8 (eight) hours as needed.   Yes Historical Provider, MD  naproxen (NAPROSYN) 500 MG tablet Take 500 mg by mouth 2 (two) times daily with a meal.   Yes Historical Provider, MD  cyclobenzaprine (FLEXERIL) 10 MG tablet Take 0.5 tablets (5 mg total) by mouth at bedtime. 03/24/15   Emnet Monk, PA-C  HYDROcodone-acetaminophen (NORCO/VICODIN) 5-325  MG per tablet Take 1 tablet by mouth every 6 (six) hours as needed for severe pain. 03/24/15   Damara Klunder, PA-C   Triage Vitals: BP 131/69 mmHg  Pulse 65  Temp(Src) 98.1 F (36.7 C) (Oral)  Resp 18  Wt 290 lb (131.543 kg)  SpO2 97%   Physical Exam  Constitutional: He is oriented to person, place, and time. He appears well-developed and well-nourished. No distress.  HENT:  Head: Normocephalic and atraumatic.  Eyes: Conjunctivae and EOM are normal. Pupils are equal, round, and reactive to light. Right eye exhibits no discharge. Left eye exhibits no discharge.  Neck:  Normal range of motion. Neck supple.  Cardiovascular: Normal rate, regular rhythm and normal heart sounds.  Exam reveals no friction rub.   No murmur heard. Pulmonary/Chest: Effort normal and breath sounds normal. No respiratory distress. He has no wheezes. He has no rales.  Abdominal: Soft. Bowel sounds are normal. He exhibits no distension. There is no tenderness. There is no rebound and no guarding.  Musculoskeletal: He exhibits no edema or tenderness.  Negative deformities identified to the spine. Negative pain upon palpation to the spine.  Full range of motion to upper extremity is bilaterally without difficulty. Discomfort with range of motion to lower extremities secondary to pain in the back.  Lymphadenopathy:    He has no cervical adenopathy.  Neurological: He is alert and oriented to person, place, and time. No cranial nerve deficit. He exhibits normal muscle tone. Coordination normal.  Cranial nerves III-XII grossly intact Strength 5+/5+ to upper and lower extremities bilaterally with resistance applied, equal distribution noted Equal grip strength bilaterally Negative saddle paresthesias Sensation intact with differentiation sharp and dull touch Negative facial droop Negative slurred speech Negative aphasia Negative arm drift Fine motor skills intact  Skin: Skin is warm and dry. No rash noted. He is not diaphoretic. No erythema.  Psychiatric: He has a normal mood and affect. His behavior is normal. Thought content normal.  Nursing note and vitals reviewed.   ED Course  Procedures (including critical care time)  DIAGNOSTIC STUDIES: Oxygen Saturation is 97% on RA, normal by my interpretation.    COORDINATION OF CARE: 1:09 PM-Discussed treatment plan which includes MR L Spine, BMP, UA with pt at bedside and pt agreed to plan.    Results for orders placed or performed during the hospital encounter of 03/24/15  Urinalysis, Routine w reflex microscopic  Result Value Ref  Range   Color, Urine YELLOW YELLOW   APPearance CLOUDY (A) CLEAR   Specific Gravity, Urine 1.015 1.005 - 1.030   pH 6.0 5.0 - 8.0   Glucose, UA NEGATIVE NEGATIVE mg/dL   Hgb urine dipstick NEGATIVE NEGATIVE   Bilirubin Urine NEGATIVE NEGATIVE   Ketones, ur NEGATIVE NEGATIVE mg/dL   Protein, ur NEGATIVE NEGATIVE mg/dL   Urobilinogen, UA 0.2 0.0 - 1.0 mg/dL   Nitrite NEGATIVE NEGATIVE   Leukocytes, UA NEGATIVE NEGATIVE  Basic metabolic panel  Result Value Ref Range   Sodium 138 135 - 145 mmol/L   Potassium 4.4 3.5 - 5.1 mmol/L   Chloride 104 101 - 111 mmol/L   CO2 28 22 - 32 mmol/L   Glucose, Bld 93 65 - 99 mg/dL   BUN 15 6 - 20 mg/dL   Creatinine, Ser 1.61 0.61 - 1.24 mg/dL   Calcium 9.1 8.9 - 09.6 mg/dL   GFR calc non Af Amer >60 >60 mL/min   GFR calc Af Amer >60 >60 mL/min   Anion gap  6 5 - 15    Labs Review Labs Reviewed  URINALYSIS, ROUTINE W REFLEX MICROSCOPIC - Abnormal; Notable for the following:    APPearance CLOUDY (*)    All other components within normal limits  BASIC METABOLIC PANEL    Imaging Review Mr Lumbar Spine Wo Contrast  03/24/2015   CLINICAL DATA:  Back pain ongoing for 7 days. Muscle spasms on the right.  EXAM: MRI LUMBAR SPINE WITHOUT CONTRAST  TECHNIQUE: Multiplanar, multisequence MR imaging of the lumbar spine was performed. No intravenous contrast was administered.  COMPARISON:  03/11/2007  FINDINGS: Mildly heterogeneous marrow, likely red marrow reconversion. No marrow signal abnormality suggestive of fracture, infection, or metastasis. Normal conus signal and morphology. No perispinal abnormality to explain back pain.  Degenerative changes:  T12- L1: Unremarkable.  L1-L2: Unremarkable.  L2-L3: Unremarkable.  L3-L4: Unremarkable.  L4-L5: L4-5 disc narrowing and desiccation. There is a posterior annular fissure with bulging. Previously seen protrusion has regressed. There is effacement of the subarticular recesses without definite impingement. Minimal  facet fluid and overgrowth.  L5-S1:Chronic mild degenerative facet overgrowth, especially on the left.  IMPRESSION: 1. No acute findings or progression from 2008. 2. L4-5 mild degenerative disc disease. Previously seen disc protrusion has regressed and canal and subarticular recess stenosis has improved, now mild. 3. Mild L4-5 and L5-S1 facet arthropathy, stable from 2008.   Electronically Signed   By: Marnee SpringJonathon  Watts M.D.   On: 03/24/2015 15:21     EKG Interpretation None      3:36 PM Dr. Madilyn Hookees at bedside assessing patient.  Physician saw and assess patient. Recommended Flexeril to be administered in ED setting. Recommended patient be discharged home and cleared patient for discharge. Recommend the patient be followed up with PCP and orthopedics. Recommend pain medications and Flexeril to be prescribed for home.   MDM   Final diagnoses:  Back pain  Lumbar disc disease    Medications  HYDROcodone-acetaminophen (NORCO/VICODIN) 5-325 MG per tablet 1 tablet (1 tablet Oral Given 03/24/15 1330)  cyclobenzaprine (FLEXERIL) tablet 10 mg (10 mg Oral Given 03/24/15 1543)    Filed Vitals:   03/24/15 1133 03/24/15 1426 03/24/15 1547  BP: 131/69 153/81 139/81  Pulse: 65 56 60  Temp: 98.1 F (36.7 C)    TempSrc: Oral    Resp: 18  16  Weight: 290 lb (131.543 kg)    SpO2: 97% 100% 100%    I personally performed the services described in this documentation, which was scribed in my presence. The recorded information has been reviewed and is accurate.  Patient presented to the ED with back pain that has been ongoing 7 days ago. Patient reported that he was having a cold, continuous coughing that resulted in some back pain. Patient reports that he's been having muscle spasms localized to the right side localized to the lumbar spine without radiation. Reported that the pain is worse with walking, patient reported that he is unable to walk secondary to pain and feels mild weakness. Patient reported that  he has history of slipped disc in the past, but has never had an MRI or surgery. Reported that he has has not had a recent fall or injury. Stated that he does do some heavy lifting. Patient reports that he does have history of kidney stones, reported that this is not similar pain. BMP unremarkable. Urinalysis negative hematoma, nitrites, leukocytes. MRI of lumbar spine without contrast noted acute findings or progression from 2008. L4-L5 mild degenerative disc disease, previously seen disc  protrusion has regressed and canal and subarticular recess stenosis has improved mild L4-5 and L5-S1 base of arthropathy. Patient seen and assessed by attending physician, Dr. Madilyn Hookees, who recommended patient to be discharged home with pain medications and muscle relaxer-cleared patient for discharge. MRI negative for acute abnormalities. Negative findings of cauda equina. Negative findings of epidural abscess. Doubt UTI, pyelonephritis. Doubt nephrolithiasis. Suspicion to be muscular pain, lumbar pain secondary to slipped disc. Patient stable, afebrile. Patient not septic appearing. Negative signs of respiratory distress. Negative signs of vascular compression. Patient has history of the same episodes in the past-reported that he has a walker at home that he uses. Discharged patient. Discharge patient with pain medication and muscle relaxer-discussed course, precautions, disposal technique. Referred patient to PCP and orthopedics. Discussed with patient to rest and stay hydrated. Discussed with patient to apply heat and massage. Discussed with patient to avoid any physical or strenuous activity. Discussed with patient to closely monitor symptoms and if symptoms are to worsen or change to report back to the ED - strict return instructions given.  Patient agreed to plan of care, understood, all questions answered.   Raymon MuttonMarissa Jolicia Delira, PA-C 03/24/15 1647  Tilden FossaElizabeth Rees, MD 03/25/15 [id number]

## 2015-03-24 NOTE — ED Notes (Signed)
Per Medic A16-Pt c/o pain and spasms in low back, focused on right side. Hx of slipped disc 2013. Pain increased after doing yard work this, pt stated that he coughed while standing and spasms started

## 2015-03-24 NOTE — ED Notes (Signed)
LABS DRAWN BY TREVOR 

## 2015-03-24 NOTE — Telephone Encounter (Signed)
PLEASE NOTE: All timestamps contained within this report are represented as [place]. CONFIDENTIALTY NOTICE: This fax transmission is intended only for the addressee. It contains information that is legally privileged, confidential or otherwise protected from use or disclosure. If you are not the intended recipient, you are strictly prohibited from reviewing, disclosing, copying using or disseminating any of this information or taking any action in reliance on or regarding this information. If you have received this fax in error, please notify us immediately by telephone so that we can arrange for its return to us. Phone: [phone], Toll-Free: [phone], Fax: [phone] Page: 1 of 1 Call Id: [id number] [facility] - Client TELEPHONE ADVICE RECORD [facility] Patient Name: Antonio Mccullough DOB: 1966-08-16 Initial Comment Caller states having severe back spasms Nurse Assessment Nurse: Charna Elizabethrumbull, RN, Cathy Date/Time (Eastern Time): 03/24/2015 9:36:06 AM Confirm and document reason for call. If symptomatic, describe symptoms. ---Caller states he developed right flank pain yesterday. No new injury in the past 3 days. No fever. Has the patient traveled out of the country within the last 30 days? ---No Does the patient require triage? ---Yes Related visit to physician within the last 2 weeks? ---No Does the PT have any chronic conditions? (i.e. diabetes, asthma, etc.) ---Yes List chronic conditions. ---Chronic back pain, Kidney Stones in the past, Gout in the past Guidelines Guideline Title Affirmed Question Affirmed Notes Flank Pain Weakness of a leg or foot (e.g., unable to bear weight, dragging foot) Final Disposition User Go to ED Now (or PCP triage) Charna Elizabethrumbull, RN, Lynden Angathy [facility]

## 2015-03-24 NOTE — Telephone Encounter (Signed)
[facility] - Client TELEPHONE ADVICE RECORD [facility] Patient Name: Antonio Mccullough Gender: Male DOB: 1966/10/11 Age: 49 Y 9 M 21 D Return Phone Number: [phone] (Primary) Address: City/State/Zip: [place] [facility] - Client Client Site [facility] Day Physician Genella MechKollar, Elizabeth Contact Type Call Call Type Triage / Clinical Relationship To Patient Self Appointment Disposition EMR Appointment Not Necessary Info pasted into Epic Yes Return Phone Number [phone] (Primary) Chief Complaint Flank Pain Initial Comment Caller states having severe back spasms PreDisposition Call Doctor Nurse Assessment Nurse: Charna Elizabethrumbull, RN, Lynden Angathy Date/Time (Eastern Time): 03/24/2015 9:36:06 AM Confirm and document reason for call. If symptomatic, describe symptoms. ---Caller states he developed right flank pain yesterday. No new injury in the past 3 days. No fever. Has the patient traveled out of the country within the last 30 days? ---No Does the patient require triage? ---Yes Related visit to physician within the last 2 weeks? ---No Does the PT have any chronic conditions? (i.e. diabetes, asthma, etc.) ---Yes List chronic conditions. ---Chronic back pain, Kidney Stones in the past, Gout in the past Guidelines Guideline Title Affirmed Question Affirmed Notes Nurse Date/Time Lamount Cohen(Eastern Time) Flank Pain Weakness of a leg or foot (e.g., unable to bear weight, dragging foot) Trumbull, RN, Lynden Angathy 03/24/2015 9:38:32 AM Disp. Time Lamount Cohen(Eastern Time) Disposition Final User 03/24/2015 9:41:20 AM Go to ED Now (or PCP triage) Yes Trumbull, RN, Frann Riderathy Caller Understands: Yes Disagree/Comply: Comply Care Advice Given Per Guideline PLEASE NOTE: All timestamps contained within this report are represented as [place] Standard Time. CONFIDENTIALTY NOTICE: This fax transmission is intended only for the addressee. It contains information that  is legally privileged, confidential or otherwise protected from use or disclosure. If you are not the intended recipient, you are strictly prohibited from reviewing, disclosing, copying using or disseminating any of this information or taking any action in reliance on or regarding this information. If you have received this fax in error, please notify us immediately by telephone so that we can arrange for its return to us. Phone: [phone], Toll-Free: [phone], Fax: [phone] Page: 2 of 2 Call Id: [id number] Care Advice Given Per Guideline GO TO ED NOW (OR PCP TRIAGE): * IF NO PCP TRIAGE: [facility] be [facility] within the next hour. Leave as soon as you can. BRING MEDICINES: * Please bring a list of your current medicines when you go to the Emergency Department (ER). * It is also a good idea to bring the pill bottles too. This will help the doctor to make certain you are taking the right medicines and the right dose. CARE ADVICE given per Flank Pain (Adult) guideline. After Care Instructions Given Call Event Type User Date / Time Description Referrals [facility]

## 2015-03-25 ENCOUNTER — Telehealth: Payer: Self-pay | Admitting: Geriatric Medicine

## 2015-03-25 MED ORDER — NAPROXEN 500 MG PO TABS: 500.0000 mg | ORAL_TABLET | Freq: Two times a day (BID) | ORAL | Status: DC

## 2015-03-25 MED ORDER — CYCLOBENZAPRINE HCL 5 MG PO TABS: 5.0000 mg | ORAL_TABLET | Freq: Three times a day (TID) | ORAL | Status: DC | PRN

## 2015-03-25 NOTE — Telephone Encounter (Signed)
Patient went to the ER for back spazms and was told to follow up in one day with PCP, or Ortho. He cannot get in at either place and does not have enough pain medication to make it through the weekend. The earliest he could get in with you would be Tuesday. He wants to know what he should do.

## 2015-03-25 NOTE — Telephone Encounter (Signed)
Spoke with patient's wife and they will go pick up mediations and will come in to see Dr. Dorise HissKollar next week.

## 2015-03-25 NOTE — Telephone Encounter (Signed)
Can refill flexeril and naproxen. I would recommend to take naproxen two times a day with food until he is seen and use flexeril TID prn as needed for pain. If pain is too severe and not managed to go to ER or urgent care.

## 2015-03-29 ENCOUNTER — Ambulatory Visit (INDEPENDENT_AMBULATORY_CARE_PROVIDER_SITE_OTHER): Payer: BLUE CROSS/BLUE SHIELD | Admitting: Internal Medicine

## 2015-03-29 ENCOUNTER — Encounter: Payer: Self-pay | Admitting: Internal Medicine

## 2015-03-29 VITALS — BP 122/74 | HR 76 | Temp 97.8°F | Resp 16 | Ht 76.0 in | Wt 296.0 lb

## 2015-03-29 DIAGNOSIS — M545 Low back pain, unspecified: Secondary | ICD-10-CM

## 2015-03-29 NOTE — Progress Notes (Signed)
   Subjective:    Patient ID: Antonio Mccullough, male    DOB: 1966/07/17, 49 y.o.   MRN: [id number]  HPI The patient is a 49 YO man who is coming in for follow up of an ER visit for back pain (MRI negative for acute changes, given some muscle relaxers). He has been taking the naproxen and the flexeril and is improving. It started about 1-2 weeks ago with coughing from a URI which is now resolved. He has been exacerbating it trying to do things around the house. Less problems with pain in the last week. No numbness, weakness. Still some feeling unsteady on his feet and is using support. Has not fallen.   Review of Systems  Constitutional: Positive for activity change. Negative for fever, appetite change and fatigue.  Respiratory: Negative.  Negative for cough, chest tightness, shortness of breath and wheezing.   Cardiovascular: Negative for chest pain, palpitations and leg swelling.  Musculoskeletal: Positive for myalgias and back pain. Negative for arthralgias, gait problem and neck stiffness.  Skin: Negative.   Neurological: Negative.       Objective:   Physical Exam  Constitutional: He is oriented to person, place, and time. He appears well-developed and well-nourished.  HENT:  Head: Normocephalic and atraumatic.  Eyes: EOM are normal.  Neck: Normal range of motion.  Cardiovascular: Normal rate and regular rhythm.   Pulmonary/Chest: Effort normal. No respiratory distress. He has no wheezes. He has no rales.  Abdominal: Soft.  Musculoskeletal: He exhibits tenderness.  Minimal tenderness over the right paraspinal lumbar region.   Neurological: He is alert and oriented to person, place, and time.  Using folded walker for stability.    Filed Vitals:   03/29/15 1005  BP: 122/74  Pulse: 76  Temp: 97.8 F (36.6 C)  TempSrc: Oral  Resp: 16  Height: 6\' 4"  (1.93 m)  Weight: 296 lb (134.265 kg)  SpO2: 99%      Assessment & Plan:

## 2015-03-29 NOTE — Assessment & Plan Note (Addendum)
Using naproxen and flexeril and >50% improved. He will continue the naproxen another week and use flexeril prn. Advised to avoid exacerbation of his back and given instructions on how to avoid injury. MRI without changes. No weakness or numbness. If no better in 1-2 weeks will order PT.

## 2015-03-29 NOTE — Patient Instructions (Signed)
We will have you keep taking the naproxen twice a day for the next week and using the flexeril if you need it.   Back Injury Prevention The following tips can help you to prevent a back injury. PHYSICAL FITNESS  Exercise often. Try to develop strong stomach (abdominal) muscles.  Do aerobic exercises often. This includes walking, jogging, biking, swimming.  Do exercises that help with balance and strength often. This includes tai chi and yoga.  Stretch before and after you exercise.  Keep a healthy weight. DIET   Ask your doctor how much calcium and vitamin D you need every day.  Include calcium in your diet. Foods high in calcium include dairy products; green, leafy vegetables; and products with calcium added (fortified).  Include vitamin D in your diet. Foods high in vitamin D include milk and products with vitamin D added.  Think about taking a multivitamin or other nutritional products called " supplements."  Stop smoking if you smoke. POSTURE   Sit and stand up straight. Avoid leaning forward or hunching over.  Choose chairs that support your lower back.  If you work at a desk:  Sit close to your work so you do not lean over.  Keep your chin tucked in.  Keep your neck drawn back.  Keep your elbows bent at a right angle. Your arms should look like the letter "L."  Sit high and close to the steering wheel when you drive. Add low back support to your car seat if needed.  Avoid sitting or standing in one position for too long. Get up and move around every hour. Take breaks if you are driving for a long time.  Sleep on your side with your knees slightly bent. You can also sleep on your back with a pillow under your knees. Do not sleep on your stomach. LIFTING, TWISTING, AND REACHING  Avoid heavy lifting, especially lifting over and over again. If you must do heavy lifting:  Stretch before lifting.  Work slowly.  Rest between lifts.  Use carts and dollies to  move objects when possible.  Make several small trips instead of carrying 1 heavy load.  Ask for help when you need it.  Ask for help when moving big, awkward objects.  Follow these steps when lifting:  Stand with your feet shoulder-width apart.  Get as close to the object as you can. Do not pick up heavy objects that are far from your body.  Use handles or lifting straps when possible.  Bend at your knees. Squat down, but keep your heels off the floor.  Keep your shoulders back, your chin tucked in, and your back straight.  Lift the object slowly. Tighten the muscles in your legs, stomach, and butt. Keep the object as close to the center of your body as possible.  Reverse these directions when you put a load down.  Do not:  Lift the object above your waist.  Twist at the waist while lifting or carrying a load. Move your feet if you need to turn, not your waist.  Bend over without bending at your knees.  Avoid reaching over your head, across a table, or for an object on a high surface. OTHER TIPS  Avoid wet floors and keep sidewalks clear of ice.  Do not sleep on a mattress that is too soft or too hard.  Keep items that you use often within easy reach.  Put heavier objects on shelves at waist level. Put lighter objects on lower  or higher shelves.  Find ways to lessen your stress. You can try exercise, massage, or relaxation.  Get help for depression or anxiety if needed. GET HELP IF:  You injure your back.  You have questions about diet, exercise, or other ways to prevent back injuries. MAKE SURE YOU:  Understand these instructions.  Will watch your condition.  Will get help right away if you are not doing well or get worse. Document Released: 04/16/2008 Document Revised: 01/21/2012 Document Reviewed: 12/10/2011 [facility] Patient Information 2015 [place], [place]. This information is not intended to replace advice given to you by your health care provider.  Make sure you discuss any questions you have with your health care provider.

## 2015-07-05 ENCOUNTER — Other Ambulatory Visit: Payer: Self-pay | Admitting: Internal Medicine

## 2016-07-01 IMAGING — MR MR LUMBAR SPINE W/O CM
4 of 5 series · 19 of 48 positions shown · non-contrast
Comparison: 03/11/2007

CLINICAL DATA: Back pain ongoing for 7 days. Muscle spasms on the
right.

EXAM:
MRI LUMBAR SPINE WITHOUT CONTRAST
TECHNIQUE: Multiplanar, multisequence MR imaging of the lumbar spine was
performed. No intravenous contrast was administered.

[Series 3: T1 · sagittal · 4.0mm · 0.55mm/px · 3 of 13 slices shown (1 of 2)]
[im 1/13]
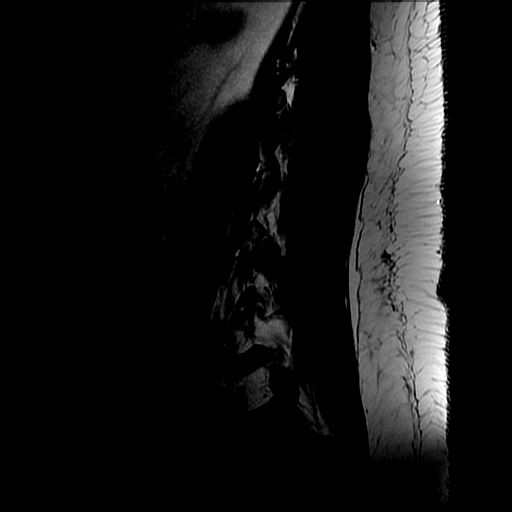
[im 7/13]
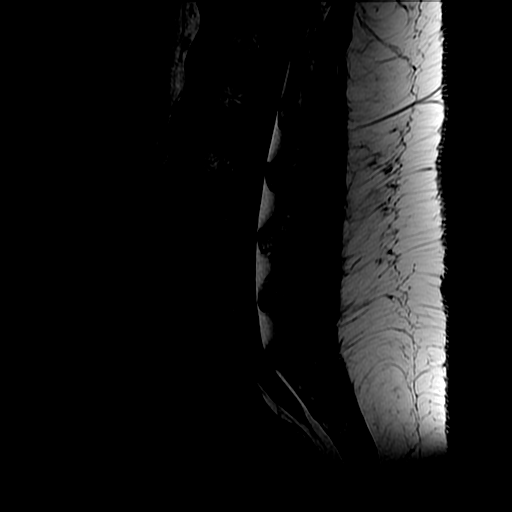
[im 13/13]
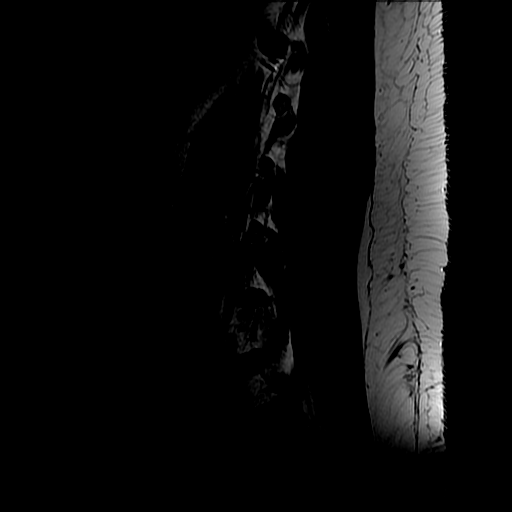

[Series 4: T2 · sagittal · 4.0mm · 0.55mm/px · 5 of 13 slices shown (1 of 2)]
[im 1/13]
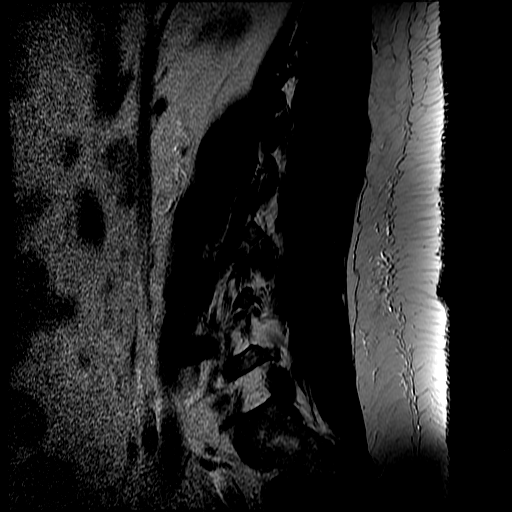
[im 4/13]
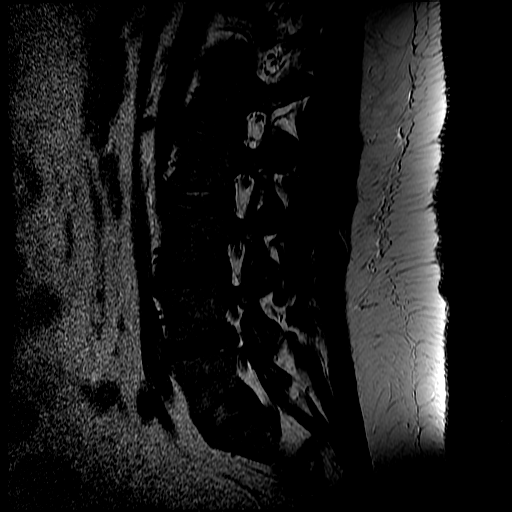
[im 7/13]
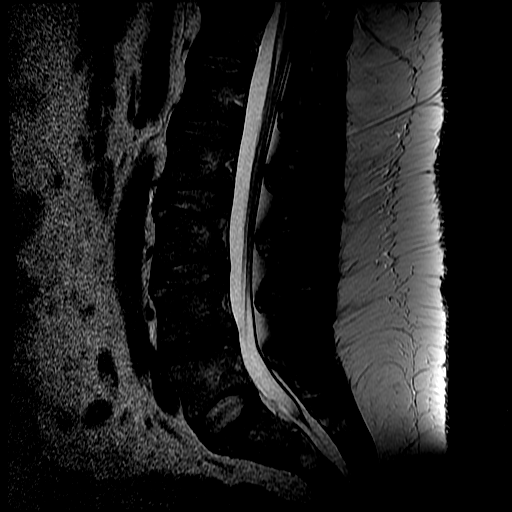
[im 10/13]
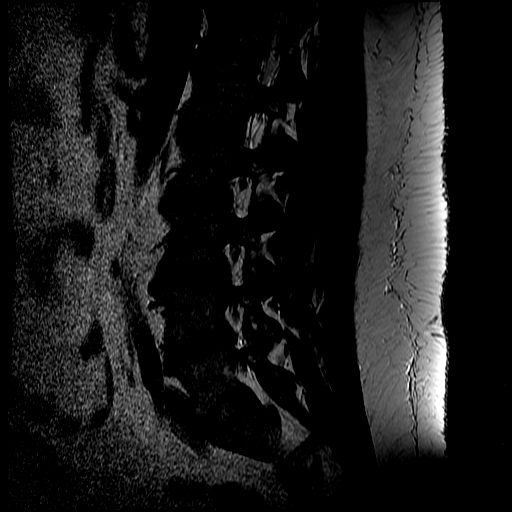
[im 13/13]
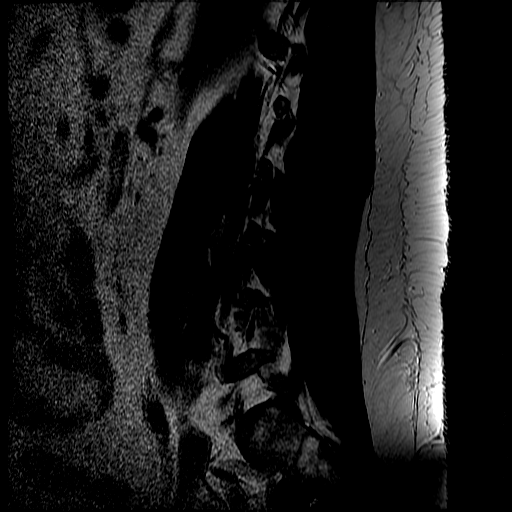

[Series 6: T2 · axial · 4.0mm · 0.39mm/px · z∈[-82,+91]mm · 8 of 37 slices shown (2 of 2)]
[im 3/37]
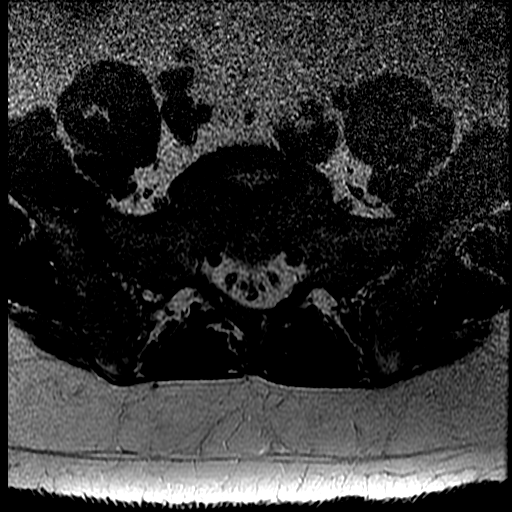
[im 5/37]
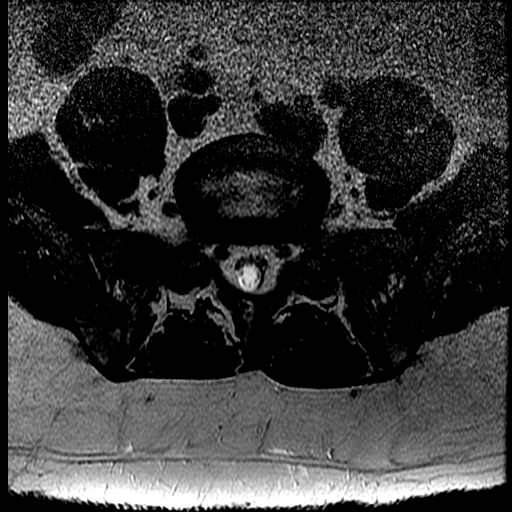
[im 8/37]
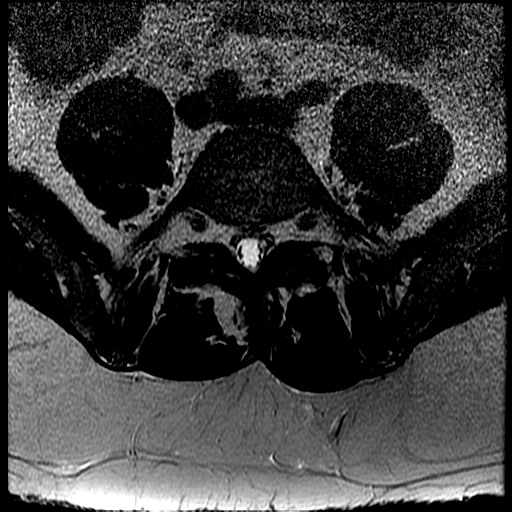
[im 13/37]
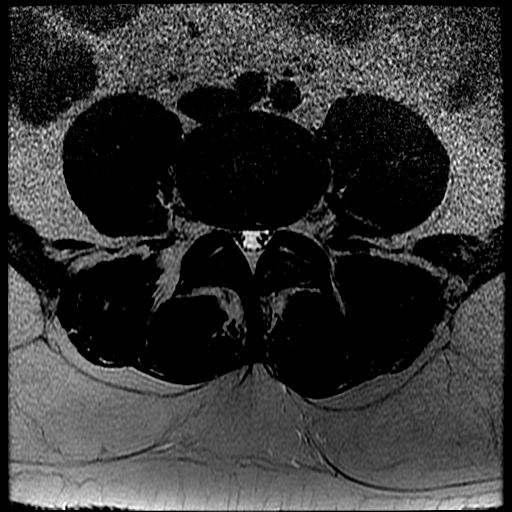
[im 17/37]
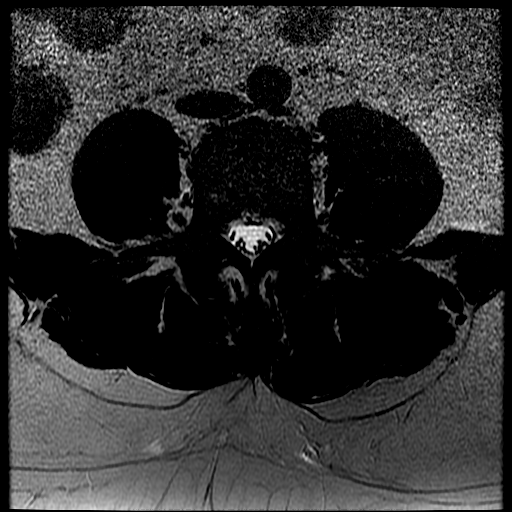
[im 20/37]
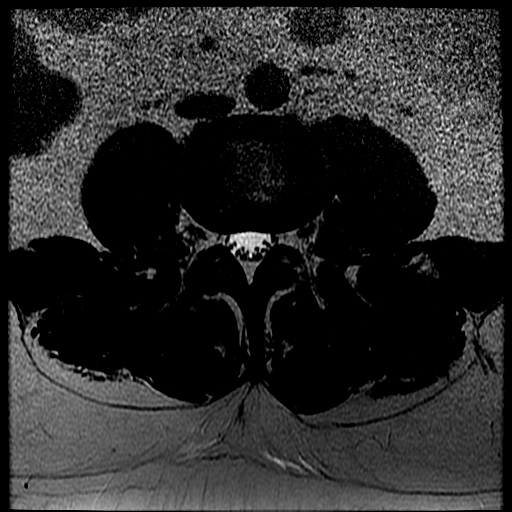
[im 22/37]
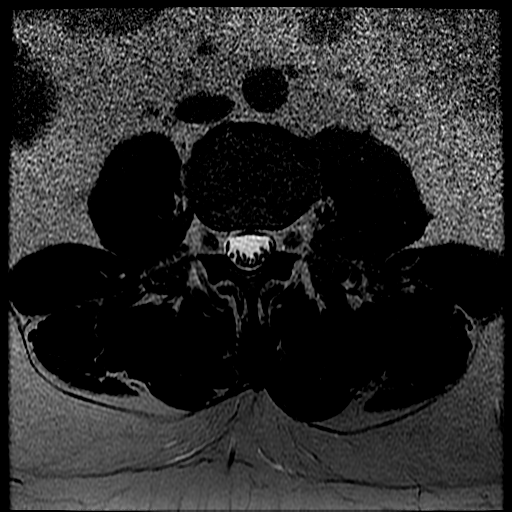
[im 32/37]
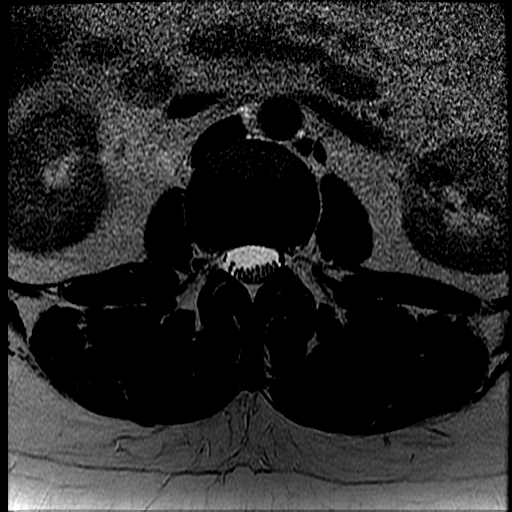

[Series 7: T1 · axial · 4.0mm · 0.39mm/px · z∈[-72,+91]mm · 3 of 37 slices shown (2 of 2)]
[im 5/37]
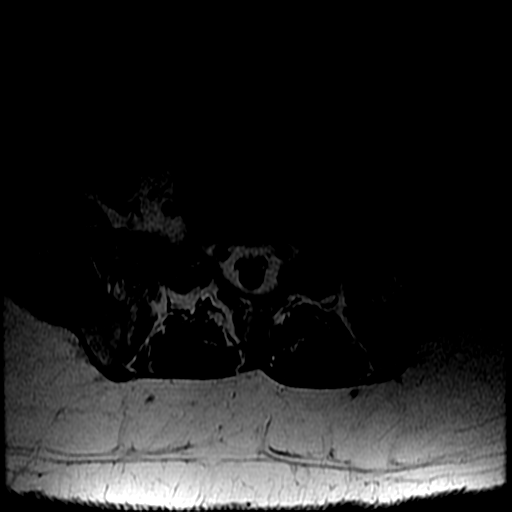
[im 20/37]
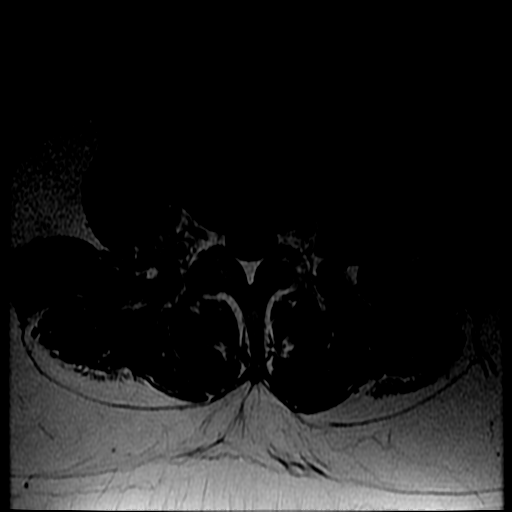
[im 32/37]
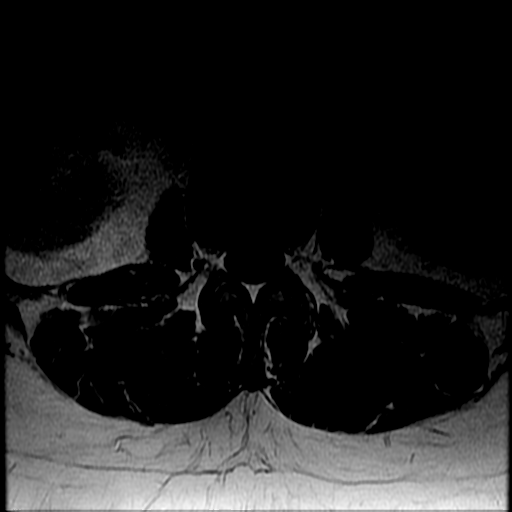

[19 of 48 positions shown; findings below may reference images not displayed]

FINDINGS: Mildly heterogeneous marrow, likely red marrow reconversion. No
marrow signal abnormality suggestive of fracture, infection, or
metastasis. Normal conus signal and morphology. No perispinal
abnormality to explain back pain.

Degenerative changes:

T12- L1: Unremarkable.

L1-L2: Unremarkable.

L2-L3: Unremarkable.

L3-L4: Unremarkable.

L4-L5: L4-5 disc narrowing and desiccation. There is a posterior
annular fissure with bulging. Previously seen protrusion has
regressed. There is effacement of the subarticular recesses without
definite impingement. Minimal facet fluid and overgrowth.

L5-S1:Chronic mild degenerative facet overgrowth, especially on the
left.
IMPRESSION: 1. No acute findings or progression from 0003.
2. L4-5 mild degenerative disc disease. Previously seen disc
protrusion has regressed and canal and subarticular recess stenosis
has improved, now mild.
3. Mild L4-5 and L5-S1 facet arthropathy, stable from 0003.

## 2017-10-25 ENCOUNTER — Encounter: Payer: Self-pay | Admitting: Internal Medicine

## 2017-10-25 ENCOUNTER — Ambulatory Visit: Payer: BLUE CROSS/BLUE SHIELD | Admitting: Internal Medicine

## 2017-10-25 DIAGNOSIS — M10071 Idiopathic gout, right ankle and foot: Secondary | ICD-10-CM

## 2017-10-25 DIAGNOSIS — M109 Gout, unspecified: Secondary | ICD-10-CM | POA: Insufficient documentation

## 2017-10-25 MED ORDER — PREDNISONE 20 MG PO TABS: 40.0000 mg | ORAL_TABLET | Freq: Every day | ORAL | 0 refills | Status: DC

## 2017-10-25 MED ORDER — INDOMETHACIN 50 MG PO CAPS: 50.0000 mg | ORAL_CAPSULE | Freq: Two times a day (BID) | ORAL | 1 refills | Status: DC

## 2017-10-25 NOTE — Progress Notes (Signed)
   Subjective:    Patient ID: Antonio Mccullough, male    DOB: Aug 10, 1966, 51 y.o.   MRN: [id number]  HPI The patient is a 51 YO man coming in for gout flare. Started around thanksgiving and took some indomethacin he had leftover. This helped for several days and so he thought it was gone. Then it came back and he went to a clinic and they gave him colchicine and indomethacin and he took this but the gout has come back about 2 days ago. He did have some dietary changes around the holidays and was eating and drinking more than usual.   Review of Systems  Constitutional: Positive for activity change. Negative for appetite change, fatigue, fever and unexpected weight change.  Respiratory: Negative.   Cardiovascular: Negative.   Musculoskeletal: Positive for arthralgias and myalgias. Negative for back pain.  Skin: Negative.   Neurological: Negative for syncope, weakness and numbness.      Objective:   Physical Exam  Constitutional: He is oriented to person, place, and time. He appears well-developed and well-nourished.  HENT:  Head: Normocephalic and atraumatic.  Eyes: EOM are normal.  Neck: Normal range of motion.  Cardiovascular: Normal rate and regular rhythm.  Pulmonary/Chest: Effort normal and breath sounds normal. No respiratory distress. He has no wheezes. He has no rales.  Abdominal: Soft.  Musculoskeletal: He exhibits tenderness. He exhibits no edema.  Pain at the right MTP with redness mild swelling.  Neurological: He is alert and oriented to person, place, and time. Coordination normal.  Skin: Skin is warm and dry.   Vitals:   10/25/17 1542  BP: 138/88  Pulse: 64  Temp: 97.6 F (36.4 C)  TempSrc: Oral  SpO2: 100%  Weight: 299 lb (135.6 kg)  Height: 6\' 4"  (1.93 m)      Assessment & Plan:

## 2017-10-25 NOTE — Patient Instructions (Signed)
We have sent in the indomethacin to use for pain as needed up to twice a day.   We have also sent in prednisone to take care of the gout flare to take 2 pills a day for 5 days.   Low-Purine Diet Purines are compounds that affect the level of uric acid in your body. A low-purine diet is a diet that is low in purines. Eating a low-purine diet can prevent the level of uric acid in your body from getting too high and causing gout or kidney stones or both. What do I need to know about this diet?  Choose low-purine foods. Examples of low-purine foods are listed in the next section.  Drink plenty of fluids, especially water. Fluids can help remove uric acid from your body. Try to drink 8-16 cups (1.9-3.8 L) a day.  Limit foods high in fat, especially saturated fat, as fat makes it harder for the body to get rid of uric acid. Foods high in saturated fat include pizza, cheese, ice cream, whole milk, fried foods, and gravies. Choose foods that are lower in fat and lean sources of protein. Use olive oil when cooking as it contains healthy fats that are not high in saturated fat.  Limit alcohol. Alcohol interferes with the elimination of uric acid from your body. If you are having a gout attack, avoid all alcohol.  Keep in mind that different people's bodies react differently to different foods. You will probably learn over time which foods do or do not affect you. If you discover that a food tends to cause your gout to flare up, avoid eating that food. You can more freely enjoy foods that do not cause problems. If you have any questions about a food item, talk to your dietitian or health care provider. Which foods are low, moderate, and high in purines? The following is a list of foods that are low, moderate, and high in purines. You can eat any amount of the foods that are low in purines. You may be able to have small amounts of foods that are moderate in purines. Ask your health care provider how much of a  food moderate in purines you can have. Avoid foods high in purines. Grains  Foods low in purines: Enriched white bread, pasta, rice, cake, cornbread, popcorn.  Foods moderate in purines: Whole-grain breads and cereals, wheat germ, bran, oatmeal. Uncooked oatmeal. Dry wheat bran or wheat germ.  Foods high in purines: Pancakes, [place] toast, biscuits, muffins. Vegetables  Foods low in purines: All vegetables, except those that are moderate in purines.  Foods moderate in purines: Asparagus, cauliflower, spinach, mushrooms, green peas. Fruits  All fruits are low in purines. Meats and other Protein Foods  Foods low in purines: Eggs, nuts, peanut butter.  Foods moderate in purines: 80-90% lean beef, lamb, veal, pork, poultry, fish, eggs, peanut butter, nuts. Crab, lobster, oysters, and shrimp. Cooked dried beans, peas, and lentils.  Foods high in purines: Anchovies, sardines, herring, mussels, tuna, codfish, scallops, trout, and haddock. Tomasa BlaseBacon. Organ meats (such as liver or kidney). Tripe. Game meat. Goose. Sweetbreads. Dairy  All dairy foods are low in purines. Low-fat and fat-free dairy products are best because they are low in saturated fat. Beverages  Drinks low in purines: Water, carbonated beverages, tea, coffee, cocoa.  Drinks moderate in purines: Soft drinks and other drinks sweetened with high-fructose corn syrup. Juices. To find whether a food or drink is sweetened with high-fructose corn syrup, look at the ingredients list.  Drinks high in purines: Alcoholic beverages (such as beer). Condiments  Foods low in purines: Salt, herbs, olives, pickles, relishes, vinegar.  Foods moderate in purines: Butter, margarine, oils, mayonnaise. Fats and Oils  Foods low in purines: All types, except gravies and sauces made with meat.  Foods high in purines: Gravies and sauces made with meat. Other Foods  Foods low in purines: Sugars, sweets, gelatin. Cake. Soups made without  meat.  Foods moderate in purines: Meat-based or fish-based soups, broths, or bouillons. Foods and drinks sweetened with high-fructose corn syrup.  Foods high in purines: High-fat desserts (such as ice cream, cookies, cakes, pies, doughnuts, and chocolate). Contact your dietitian for more information on foods that are not listed here. This information is not intended to replace advice given to you by your health care provider. Make sure you discuss any questions you have with your health care provider. Document Released: 02/23/2011 Document Revised: 04/05/2016 Document Reviewed: 10/05/2013 Elsevier Interactive Patient Education  2017 [employer]

## 2017-10-25 NOTE — Assessment & Plan Note (Signed)
Rx for prednisone and indomethacin. No flare in several years so he declines to pursue long term preventative therapy at this time.

## 2019-03-08 ENCOUNTER — Telehealth: Payer: BLUE CROSS/BLUE SHIELD | Admitting: Physician Assistant

## 2019-03-08 DIAGNOSIS — R399 Unspecified symptoms and signs involving the genitourinary system: Secondary | ICD-10-CM

## 2019-03-08 NOTE — Progress Notes (Signed)
Based on what you shared with me, I feel your condition warrants further evaluation and I recommend that you be seen for a face to face office visit.     NOTE: If you entered your credit card information for this eVisit, you will not be charged. You may see a "hold" on your card for the $35 but that hold will drop off and you will not have a charge processed.  If you are having a true medical emergency please call 911.  If you need an urgent face to face visit, [place] has four urgent care centers for your convenience.    PLEASE NOTE: THE [facility]STAC[facility] LOCATIONS AND URGENT CARE CLINICS DO NOT HAVE THE TESTING FOR CORONAVIRUS COVID19 AVAILABLE.  IF YOU FEEL YOU NEED THIS TEST YOU MUST GO TO A TRIAGE LOCATION AT ONE OF THE HOSPITAL EMERGENCY DEPARTMENTS ?  [email or web address] to reserve your spot online an avoid wait times  [facility]  [address], [address] [place], [place] [address] Modified hours of operation: Monday-Friday, 10 AM to 6 PM  Saturday & Sunday 10 AM to 4 PM *Across the street from [employer]  [facility] [place] (New Address!) [address], [address] [place], [place] [address] *Just off [address], across the [address] from [employer]* Modified hours of operation: Monday-Friday, 10 AM to 5 PM  Closed Saturday & Sunday   The following sites will take your insurance:  [facility]  [phone] Get Driving Directions Find a Provider at this Location  [address] [place], [place] [address] 10 am to 8 pm Monday-Friday 12 pm to 8 pm [facility]   [facility]  [phone] Get Driving Directions Find a Provider at this Location  [address], [address] [place], [place] [address] 8 am to 8 pm Monday-Friday 9 am to 6 pm Saturday 11 am to 6 pm Sunday   [facility] [place]  [phone] Get Driving Directions  [id number] [place], [place] [address] 8 am to 8 pm Monday-Friday 8 am to 4 pm Saturday-Sunday   Your e-visit answers were reviewed by a board certified advanced clinical practitioner to complete your personal care plan.  Thank you for using e-Visits.  ===View-only below this line===   ----- Message -----    From: Eduard ClosBrian M Riden    Sent: 03/08/2019 10:03 AM EDT      To: E-Visit Mailing List Subject: E-Visit Submission: Urinary Problems  E-Visit Submission: Urinary Problems --------------------------------  Question: Which of the following are you experiencing? Answer:   Difficulty passing urine  Question: Are you able to pass urine? Answer:   Yes, I can pass urine.  Question: How long have you had pain or difficulty passing urine? Answer:   More than one week  Question: Do you have a fever? Answer:   No, I do not have a fever  Question: Do you have any of the following? Answer:   I have none of these problems  Question: Do you have an exaggerated sensation of the need to pass urine? Answer:   No, the sensation is normal  Question: Do you have the urge to urinate more of less frequently than normal? Answer:   The same as normal  Question: What does your urine look like? Answer:   It is clear  Question: Do you have any of the following? Answer:   None of the above  Question: Do you have any  of the following? Answer:   No discharge  Question: Do you have any sores on your genitals? Answer:   No  Question: Do you have any history of kidney dysfunction or kidney problems? Answer:   Yes  Question: Within the past 3 months, have you had any surgery on your kidneys or bladder, or have you had a tube inserted to collect your urine? Answer:   No, I have never had either  Question: Have you had similar symptoms in the past? Answer:   No, I have never had  these symptoms  Question: Please list any additional comments  Answer:   Phimosis - shrinking, tightening foreskin.  Can't  pull back without pain, tearing. Interfering with urination and sex.  Question: Please list your medication allergies that you may have ? (If 'none' , please list as 'none') Answer:   None

## 2019-05-05 ENCOUNTER — Encounter: Payer: Self-pay | Admitting: Internal Medicine

## 2019-05-05 ENCOUNTER — Ambulatory Visit (INDEPENDENT_AMBULATORY_CARE_PROVIDER_SITE_OTHER): Payer: BC Managed Care – PPO | Admitting: Internal Medicine

## 2019-05-05 DIAGNOSIS — N478 Other disorders of prepuce: Secondary | ICD-10-CM

## 2019-05-05 NOTE — Assessment & Plan Note (Signed)
Referral urgent to urology. It sounds as though he will likely need a circumcision due to foreskin tearing and causing pain with normal activities. Does not have concerns for infection or STI at this time.

## 2019-05-05 NOTE — Progress Notes (Signed)
Virtual Visit via Video Note  I connected with Antonio Mccullough on 05/05/19 at  9:20 AM EDT by a video enabled telemedicine application and verified that I am speaking with the correct person using two identifiers.  The patient and the provider were at separate locations throughout the entire encounter.   I discussed the limitations of evaluation and management by telemedicine and the availability of in person appointments. The patient expressed understanding and agreed to proceed.  History of Present Illness: The patient is a 53 y.o. man with visit for foreskin problem. Was not circumsized as a child. His foreskin is losing elasticity and is very rigid lately in the last year. It tears often and can hurt. This happens with urination or sexual activity. This is causing him problems. It is worsening significantly in the last several months. Has no burning with urination or signs of infection or rash on the penis. The penis does not have a bend to it with or without erection. Denies fevers or chills. Overall it is worsening. Has tried being very careful and this does not help much.  Observations/Objective: Appearance: normal, breathing appears normal, casual grooming, abdomen does not appear distended, genitalia not examined, mental status is A and O times 3  Assessment and Plan: See problem oriented charting  Follow Up Instructions: referral to urology for likely circumcision  I discussed the assessment and treatment plan with the patient. The patient was provided an opportunity to ask questions and all were answered. The patient agreed with the plan and demonstrated an understanding of the instructions.   The patient was advised to call back or seek an in-person evaluation if the symptoms worsen or if the condition fails to improve as anticipated.  Hoyt Koch, MD

## 2019-05-08 DIAGNOSIS — N471 Phimosis: Secondary | ICD-10-CM | POA: Diagnosis not present

## 2019-05-11 ENCOUNTER — Other Ambulatory Visit: Payer: Self-pay | Admitting: Urology

## 2019-06-23 ENCOUNTER — Other Ambulatory Visit: Payer: Self-pay

## 2019-06-23 ENCOUNTER — Other Ambulatory Visit (HOSPITAL_COMMUNITY)
Admission: RE | Admit: 2019-06-23 | Discharge: 2019-06-23 | Disposition: A | Discharge status: Home / Self Care | Payer: BC Managed Care – PPO | Source: Ambulatory Visit | Attending: Urology | Admitting: Urology

## 2019-06-23 ENCOUNTER — Encounter (HOSPITAL_BASED_OUTPATIENT_CLINIC_OR_DEPARTMENT_OTHER): Payer: Self-pay | Admitting: *Deleted

## 2019-06-23 DIAGNOSIS — Z01812 Encounter for preprocedural laboratory examination: Secondary | ICD-10-CM | POA: Insufficient documentation

## 2019-06-23 DIAGNOSIS — Z20828 Contact with and (suspected) exposure to other viral communicable diseases: Secondary | ICD-10-CM | POA: Diagnosis not present

## 2019-06-23 LAB — SARS CORONAVIRUS 2 (TAT 6-24 HRS): SARS Coronavirus 2: NEGATIVE

## 2019-06-23 NOTE — Progress Notes (Signed)
Spoke w/ pt via phone for pre-op interview.  Npo after mn.  Arrive at 0945.  Pt getting covid test today.

## 2019-06-26 ENCOUNTER — Other Ambulatory Visit: Payer: Self-pay

## 2019-06-26 ENCOUNTER — Ambulatory Visit (HOSPITAL_BASED_OUTPATIENT_CLINIC_OR_DEPARTMENT_OTHER)
Admission: RE | Admit: 2019-06-26 | Discharge: 2019-06-26 | Disposition: A | Discharge status: Home / Self Care | Payer: BC Managed Care – PPO | Attending: Urology | Admitting: Urology

## 2019-06-26 ENCOUNTER — Encounter (HOSPITAL_BASED_OUTPATIENT_CLINIC_OR_DEPARTMENT_OTHER)
Admission: RE | Disposition: A | Discharge status: Home / Self Care | Payer: Self-pay | Source: Home / Self Care | Attending: Urology

## 2019-06-26 ENCOUNTER — Ambulatory Visit (HOSPITAL_BASED_OUTPATIENT_CLINIC_OR_DEPARTMENT_OTHER): Payer: BC Managed Care – PPO | Admitting: Certified Registered Nurse Anesthetist

## 2019-06-26 ENCOUNTER — Encounter (HOSPITAL_BASED_OUTPATIENT_CLINIC_OR_DEPARTMENT_OTHER): Payer: Self-pay

## 2019-06-26 DIAGNOSIS — M109 Gout, unspecified: Secondary | ICD-10-CM | POA: Diagnosis not present

## 2019-06-26 DIAGNOSIS — G4733 Obstructive sleep apnea (adult) (pediatric): Secondary | ICD-10-CM | POA: Diagnosis not present

## 2019-06-26 DIAGNOSIS — N471 Phimosis: Secondary | ICD-10-CM | POA: Insufficient documentation

## 2019-06-26 DIAGNOSIS — N200 Calculus of kidney: Secondary | ICD-10-CM | POA: Diagnosis not present

## 2019-06-26 HISTORY — DX: Personal history of urinary calculi: Z87.442

## 2019-06-26 HISTORY — DX: Phimosis: N47.1

## 2019-06-26 HISTORY — PX: CIRCUMCISION: SHX1350

## 2019-06-26 HISTORY — DX: Personal history of other diseases of the musculoskeletal system and connective tissue: Z87.39

## 2019-06-26 HISTORY — DX: Gastro-esophageal reflux disease without esophagitis: K21.9

## 2019-06-26 SURGERY — CIRCUMCISION, ADULT
Anesthesia: General | Site: Penis

## 2019-06-26 MED ORDER — LIDOCAINE HCL (CARDIAC) PF 100 MG/5ML IV SOSY
PREFILLED_SYRINGE | INTRAVENOUS | Status: DC | PRN
  Administered 2019-06-26: 100 mg via INTRAVENOUS

## 2019-06-26 MED ORDER — FENTANYL CITRATE (PF) 100 MCG/2ML IJ SOLN
INTRAMUSCULAR | Status: AC
  Filled 2019-06-26: qty 2

## 2019-06-26 MED ORDER — ACETAMINOPHEN 160 MG/5ML PO SOLN
325.0000 mg | ORAL | Status: DC | PRN
  Filled 2019-06-26: qty 20.3

## 2019-06-26 MED ORDER — KETOROLAC TROMETHAMINE 30 MG/ML IJ SOLN
30.0000 mg | Freq: Once | INTRAMUSCULAR | Status: DC | PRN
  Filled 2019-06-26: qty 1

## 2019-06-26 MED ORDER — LIDOCAINE HCL 1 % IJ SOLN
INTRAMUSCULAR | Status: AC
  Filled 2019-06-26: qty 20

## 2019-06-26 MED ORDER — MIDAZOLAM HCL 5 MG/5ML IJ SOLN
INTRAMUSCULAR | Status: DC | PRN
  Administered 2019-06-26: 2 mg via INTRAVENOUS

## 2019-06-26 MED ORDER — ONDANSETRON HCL 4 MG/2ML IJ SOLN
4.0000 mg | Freq: Once | INTRAMUSCULAR | Status: DC | PRN
  Filled 2019-06-26: qty 2

## 2019-06-26 MED ORDER — FENTANYL CITRATE (PF) 100 MCG/2ML IJ SOLN
INTRAMUSCULAR | Status: DC | PRN
  Administered 2019-06-26: 100 ug via INTRAVENOUS

## 2019-06-26 MED ORDER — TRAMADOL HCL 50 MG PO TABS: 50.0000 mg | ORAL_TABLET | Freq: Four times a day (QID) | ORAL | 0 refills | Status: AC | PRN

## 2019-06-26 MED ORDER — OXYCODONE HCL 5 MG PO TABS
5.0000 mg | ORAL_TABLET | Freq: Once | ORAL | Status: DC | PRN
  Filled 2019-06-26: qty 1

## 2019-06-26 MED ORDER — PROPOFOL 10 MG/ML IV BOLUS
INTRAVENOUS | Status: AC
  Filled 2019-06-26: qty 20

## 2019-06-26 MED ORDER — CEFAZOLIN SODIUM-DEXTROSE 2-4 GM/100ML-% IV SOLN
2.0000 g | INTRAVENOUS | Status: AC
  Administered 2019-06-26: 12:00:00 2 g via INTRAVENOUS
  Filled 2019-06-26: qty 100

## 2019-06-26 MED ORDER — PROPOFOL 10 MG/ML IV BOLUS
INTRAVENOUS | Status: DC | PRN
  Administered 2019-06-26: 300 mg via INTRAVENOUS

## 2019-06-26 MED ORDER — ONDANSETRON HCL 4 MG/2ML IJ SOLN
INTRAMUSCULAR | Status: DC | PRN
  Administered 2019-06-26: 4 mg via INTRAVENOUS

## 2019-06-26 MED ORDER — ONDANSETRON HCL 4 MG/2ML IJ SOLN
INTRAMUSCULAR | Status: AC
  Filled 2019-06-26: qty 2

## 2019-06-26 MED ORDER — BUPIVACAINE HCL (PF) 0.25 % IJ SOLN
INTRAMUSCULAR | Status: AC
  Filled 2019-06-26: qty 30

## 2019-06-26 MED ORDER — 0.9 % SODIUM CHLORIDE (POUR BTL) OPTIME
TOPICAL | Status: DC | PRN
  Administered 2019-06-26: 500 mL

## 2019-06-26 MED ORDER — CEFAZOLIN SODIUM-DEXTROSE 1-4 GM/50ML-% IV SOLN
INTRAVENOUS | Status: DC | PRN
  Administered 2019-06-26: 1 g via INTRAVENOUS

## 2019-06-26 MED ORDER — FENTANYL CITRATE (PF) 100 MCG/2ML IJ SOLN
25.0000 ug | INTRAMUSCULAR | Status: DC | PRN
  Filled 2019-06-26: qty 1

## 2019-06-26 MED ORDER — MEPERIDINE HCL 25 MG/ML IJ SOLN
6.2500 mg | INTRAMUSCULAR | Status: DC | PRN
  Filled 2019-06-26: qty 1

## 2019-06-26 MED ORDER — LIDOCAINE 2% (20 MG/ML) 5 ML SYRINGE
INTRAMUSCULAR | Status: AC
  Filled 2019-06-26: qty 5

## 2019-06-26 MED ORDER — CEFAZOLIN SODIUM-DEXTROSE 2-4 GM/100ML-% IV SOLN
INTRAVENOUS | Status: AC
  Filled 2019-06-26: qty 100

## 2019-06-26 MED ORDER — LACTATED RINGERS IV SOLN
INTRAVENOUS | Status: DC
  Administered 2019-06-26: 1000 mL via INTRAVENOUS
  Filled 2019-06-26: qty 1000

## 2019-06-26 MED ORDER — MIDAZOLAM HCL 2 MG/2ML IJ SOLN
INTRAMUSCULAR | Status: AC
  Filled 2019-06-26: qty 2

## 2019-06-26 MED ORDER — BUPIVACAINE HCL 0.25 % IJ SOLN
INTRAMUSCULAR | Status: DC | PRN
  Administered 2019-06-26: 18 mL

## 2019-06-26 MED ORDER — ACETAMINOPHEN 325 MG PO TABS
325.0000 mg | ORAL_TABLET | ORAL | Status: DC | PRN
  Filled 2019-06-26: qty 2

## 2019-06-26 MED ORDER — OXYCODONE HCL 5 MG/5ML PO SOLN
5.0000 mg | Freq: Once | ORAL | Status: DC | PRN
  Filled 2019-06-26: qty 5

## 2019-06-26 MED ORDER — CEFAZOLIN SODIUM-DEXTROSE 1-4 GM/50ML-% IV SOLN
INTRAVENOUS | Status: AC
  Filled 2019-06-26: qty 50

## 2019-06-26 SURGICAL SUPPLY — 32 items
BANDAGE CO FLEX L/F 1IN X 5YD (GAUZE/BANDAGES/DRESSINGS) ×3 IMPLANT
BLADE SURG 15 STRL LF DISP TIS (BLADE) ×1 IMPLANT
BLADE SURG 15 STRL SS (BLADE) ×3
BNDG COHESIVE 1X5 TAN STRL LF (GAUZE/BANDAGES/DRESSINGS) ×2 IMPLANT
BNDG CONFORM 2 STRL LF (GAUZE/BANDAGES/DRESSINGS) ×3 IMPLANT
COVER BACK TABLE 60X90IN (DRAPES) ×3 IMPLANT
COVER MAYO STAND STRL (DRAPES) ×3 IMPLANT
COVER WAND RF STERILE (DRAPES) ×6 IMPLANT
DRAPE LAPAROTOMY 100X72 PEDS (DRAPES) ×3 IMPLANT
ELECT NDL TIP 2.8 STRL (NEEDLE) IMPLANT
ELECT NEEDLE TIP 2.8 STRL (NEEDLE) IMPLANT
ELECT REM PT RETURN 9FT ADLT (ELECTROSURGICAL) ×3
ELECTRODE REM PT RTRN 9FT ADLT (ELECTROSURGICAL) ×1 IMPLANT
GAUZE VASELINE 1X8 (GAUZE/BANDAGES/DRESSINGS) IMPLANT
GAUZE XEROFORM 1X8 LF (GAUZE/BANDAGES/DRESSINGS) ×2 IMPLANT
GLOVE BIO SURGEON STRL SZ8 (GLOVE) ×3 IMPLANT
GOWN STRL REUS W/ TWL XL LVL3 (GOWN DISPOSABLE) ×1 IMPLANT
GOWN STRL REUS W/TWL XL LVL3 (GOWN DISPOSABLE) ×3
KIT TURNOVER CYSTO (KITS) ×3 IMPLANT
MANIFOLD NEPTUNE II (INSTRUMENTS) IMPLANT
NEEDLE HYPO 22GX1.5 SAFETY (NEEDLE) ×3 IMPLANT
NS IRRIG 500ML POUR BTL (IV SOLUTION) ×3 IMPLANT
PACK BASIN DAY SURGERY FS (CUSTOM PROCEDURE TRAY) ×3 IMPLANT
PENCIL BUTTON HOLSTER BLD 10FT (ELECTRODE) ×3 IMPLANT
SUT CHROMIC 4 0 RB 1X27 (SUTURE) ×6 IMPLANT
SUT CHROMIC 5 0 RB 1 27 (SUTURE) IMPLANT
SYR CONTROL 10ML LL (SYRINGE) ×3 IMPLANT
TOWEL OR 17X26 10 PK STRL BLUE (TOWEL DISPOSABLE) ×6 IMPLANT
TRAY DSU PREP LF (CUSTOM PROCEDURE TRAY) ×3 IMPLANT
TUBE CONNECTING 12'X1/4 (SUCTIONS)
TUBE CONNECTING 12X1/4 (SUCTIONS) IMPLANT
WATER STERILE IRR 500ML POUR (IV SOLUTION) ×3 IMPLANT

## 2019-06-26 NOTE — Anesthesia Preprocedure Evaluation (Signed)
Anesthesia Evaluation  Patient identified by MRN, date of birth, ID band Patient awake    Airway Mallampati: II       Dental no notable dental hx. (+) Teeth Intact   Pulmonary    Pulmonary exam normal breath sounds clear to auscultation       Cardiovascular negative cardio ROS Normal cardiovascular exam Rhythm:Regular Rate:Normal     Neuro/Psych negative neurological ROS  negative psych ROS   GI/Hepatic Neg liver ROS,   Endo/Other  negative endocrine ROS  Renal/GU   negative genitourinary   Musculoskeletal negative musculoskeletal ROS (+)   Abdominal (+) + obese,   Peds  Hematology negative hematology ROS (+)   Anesthesia Other Findings   Reproductive/Obstetrics                             Anesthesia Physical Anesthesia Plan  ASA: II  Anesthesia Plan: General   Post-op Pain Management:    Induction: Intravenous  PONV Risk Score and Plan: 3 and Ondansetron, Dexamethasone and Midazolam  Airway Management Planned: LMA  Additional Equipment: None  Intra-op Plan:   Post-operative Plan: Extubation in OR  Informed Consent: I have reviewed the patients History and Physical, chart, labs and discussed the procedure including the risks, benefits and alternatives for the proposed anesthesia with the patient or authorized representative who has indicated his/her understanding and acceptance.     Dental advisory given  Plan Discussed with: CRNA  Anesthesia Plan Comments:         Anesthesia Quick Evaluation

## 2019-06-26 NOTE — H&P (Signed)
H&P  Chief Complaint: Phimosis  History of Present Illness:  53 year old male with significant phimosis presents at this time for circumcision.  Past Medical History:  Diagnosis Date  . GERD (gastroesophageal reflux disease)   . History of gout    06-23-2019  per pt last episode 2018  . History of kidney stones   . Phimosis     Past Surgical History:  Procedure Laterality Date  . NO PAST SURGERIES    . WISDOM TOOTH EXTRACTION      Home Medications:    Allergies: No Known Allergies  History reviewed. No pertinent family history.  Social History:  reports that he has never smoked. He has never used smokeless tobacco. He reports current alcohol use of about 14.0 standard drinks of alcohol per week. He reports that he does not use drugs.  ROS: A complete review of systems was performed.  All systems are negative except for pertinent findings as noted.  Physical Exam:  Vital signs in last 24 hours: Temp:  [97.9 F (36.6 C)] 97.9 F (36.6 C) (08/14 0900) Pulse Rate:  [59] 59 (08/14 0900) Resp:  [16] 16 (08/14 0900) BP: (146)/(85) 146/85 (08/14 0900) SpO2:  [100 %] 100 % (08/14 0900) Weight:  [505 kg] 137 kg (08/14 0900) Constitutional:  Alert and oriented, No acute distress Cardiovascular: Regular rate  Respiratory: Normal respiratory effort GI: Abdomen is soft, nontender, nondistended, no abdominal masses. No CVAT.  Genitourinary: Normal male phallus, testes are descended bilaterally and non-tender and without masses, scrotum is normal in appearance without lesions or masses, perineum is normal on inspection. Lymphatic: No lymphadenopathy Neurologic: Grossly intact, no focal deficits Psychiatric: Normal mood and affect  Laboratory Data:  No results for input(s): WBC, HGB, HCT, PLT in the last 72 hours.  No results for input(s): NA, K, CL, GLUCOSE, BUN, CALCIUM, CREATININE in the last 72 hours.  Invalid input(s): CO3   No results found for this or any previous  visit (from the past 24 hour(s)). Recent Results (from the past 240 hour(s))  SARS CORONAVIRUS 2 Nasal Swab Aptima Multi Swab     Status: None   Collection Time: 06/23/19 11:04 AM   Specimen: Aptima Multi Swab; Nasal Swab  Result Value Ref Range Status   SARS Coronavirus 2 NEGATIVE NEGATIVE Final    Comment: (NOTE) SARS-CoV-2 target nucleic acids are NOT DETECTED. The SARS-CoV-2 RNA is generally detectable in upper and lower respiratory specimens during the acute phase of infection. Negative results do not preclude SARS-CoV-2 infection, do not rule out co-infections with other pathogens, and should not be used as the sole basis for treatment or other patient management decisions. Negative results must be combined with clinical observations, patient history, and epidemiological information. The expected result is Negative. Fact Sheet for Patients: SugarRoll.be Fact Sheet for Healthcare Providers: https://www.woods-mathews.com/ This test is not yet approved or cleared by the [place] FDA and  has been authorized for detection and/or diagnosis of SARS-CoV-2 by FDA under an Emergency Use Authorization (EUA). This EUA will remain  in effect (meaning this test can be used) for the duration of the COVID-19 declaration under Section 56 4(b)(1) of the Act, 21 U.S.C. section 360bbb-3(b)(1), unless the authorization is terminated or revoked sooner. Performed at [facility], [place] [address], [place], [place] [address]     Renal Function: No results for input(s): CREATININE in the last 168 hours. CrCl cannot be calculated (Patient's most recent lab result is older than the maximum 21 days  allowed.).  Radiologic Imaging: No results found.  Impression/Assessment:    Problematic phimosis  Plan:   circumcision

## 2019-06-26 NOTE — Op Note (Signed)
Preoperative diagnosis: Phimosis Postoperative diagnosis: Same  Procedure: Circumcision Surgeon: Ashiyah Pavlak M. Kieren Adkison, MD. Anesthesia: Gen. with penile block Specimen: foreskin Complications: none EBL:minimal  Indications: The patient was recently evaluated for phimosis. All risks and benefits of circumcision discussed. Full informed consent obtained. The patient now presents for definitive procedure.  Technique and findings: The patient was brought to the operating room. Successful induction of general anesthesia.  The patient was then prepped and draped in usual manner. Appropriate surgical timeout was performed. A penile block was then performed with 20cc of quarter percent plain Marcaine. Proximal and distal incision sites were marked with a pen, and appropriate circumferential incisions were created. The sleeve of redundant skin was removed with the bovie. Hemostatis was achieved using electrocautery.Quadrant sutures were placed with interrupted 4-0 chromic, with the phrenular suture being a "U" stitch. In between, the same 4-0 chromic was used to re approximate the skin edges with a running simple stitch.  The incision was wrapped with Xeroform gauze, a plain guaze wrap and Coban dressing. The patient was brought to recovery room in stable condition having had no obvious complications or problems. Sponge and needle counts were correct.       

## 2019-06-26 NOTE — Anesthesia Procedure Notes (Signed)
Procedure Name: LMA Insertion Date/Time: 06/26/2019 12:15 PM Performed by: Hewitt Blade, CRNA Pre-anesthesia Checklist: Patient identified, Emergency Drugs available, Suction available and Patient being monitored Patient Re-evaluated:Patient Re-evaluated prior to induction Oxygen Delivery Method: Circle system utilized Preoxygenation: Pre-oxygenation with 100% oxygen Induction Type: IV induction LMA: LMA inserted LMA Size: 5.0 Number of attempts: 1 Placement Confirmation: positive ETCO2 and breath sounds checked- equal and bilateral Tube secured with: Tape Dental Injury: Teeth and Oropharynx as per pre-operative assessment

## 2019-06-26 NOTE — Interval H&P Note (Signed)
History and Physical Interval Note:  06/26/2019 11:58 AM  Antonio Mccullough  has presented today for surgery, with the diagnosis of PHIMOSIS.  The various methods of treatment have been discussed with the patient and family. After consideration of risks, benefits and other options for treatment, the patient has consented to  Procedure(s) with comments: CIRCUMCISION ADULT (N/A) - 45 MINS as a surgical intervention.  The patient's history has been reviewed, patient examined, no change in status, stable for surgery.  I have reviewed the patient's chart and labs.  Questions were answered to the patient's satisfaction.     Lillette Boxer Jessalynn Mccowan

## 2019-06-26 NOTE — Transfer of Care (Signed)
Immediate Anesthesia Transfer of Care Note  Patient: Antonio Mccullough  Procedure(s) Performed: CIRCUMCISION ADULT (N/A Penis)  Patient Location: PACU  Anesthesia Type:General  Level of Consciousness: awake, alert  and oriented  Airway & Oxygen Therapy: Patient Spontanous Breathing and Patient connected to nasal cannula oxygen  Post-op Assessment: Report given to RN, Post -op Vital signs reviewed and stable and Patient moving all extremities  Post vital signs: Reviewed and stable  Last Vitals:  Vitals Value Taken Time  BP    Temp 36.4 C 06/26/19 1304  Pulse 68 06/26/19 1304  Resp 14 06/26/19 1304  SpO2 96 % 06/26/19 1304  Vitals shown include unvalidated device data.  Last Pain:  Vitals:   06/26/19 0920  TempSrc:   PainSc: 0-No pain      Patients Stated Pain Goal: 5 (31/54/00 8676)  Complications: No apparent anesthesia complications

## 2019-06-26 NOTE — Anesthesia Postprocedure Evaluation (Signed)
Anesthesia Post Note  Patient: Antonio Mccullough  Procedure(s) Performed: CIRCUMCISION ADULT (N/A Penis)     Patient location during evaluation: Phase II Anesthesia Type: General Level of consciousness: awake Pain management: pain level controlled Vital Signs Assessment: post-procedure vital signs reviewed and stable Respiratory status: spontaneous breathing Cardiovascular status: stable Postop Assessment: no apparent nausea or vomiting Anesthetic complications: no    Last Vitals:  Vitals:   06/26/19 1330 06/26/19 1345  BP: (!) 145/89 (!) 148/92  Pulse: 62 65  Resp: (!) 9 15  Temp:    SpO2: 98% 94%    Last Pain:  Vitals:   06/26/19 1345  TempSrc:   PainSc: 4    Pain Goal: Patients Stated Pain Goal: 5 (06/26/19 0920)                 [name]

## 2019-06-26 NOTE — Discharge Instructions (Signed)
Postoperative instructions for circumcision  Wound:  Remove the dressing the morning after surgery. In most cases your incision will have absorbable sutures that run along the course of your incision and will dissolve within the first 10-20 days. Some will fall out even earlier. Expect some redness as the sutures dissolved but this should occur only around the sutures. If there is generalized redness, especially with increasing pain or swelling, let us know. The penis will possibly get "black and blue" as the blood in the tissues spread. Sometimes the whole penis will turn colors. The black and blue is followed by a yellow and brown color. In time, all the discoloration will go away.  Diet:  You may return to your normal diet within 24 hours following your surgery. You may note some mild nausea and possibly vomiting the first 6-8 hours following surgery. This is usually due to the side effects of anesthesia, and will disappear quite soon. I would suggest clear liquids and a very light meal the first evening following your surgery.  Activity:  Your physical activity should be restricted the first 48 hours. During that time you should remain relatively inactive, moving about only when necessary. During the first 7-10 days following surgery he should avoid lifting any heavy objects (anything greater than 15 pounds), and avoid strenuous exercise. If you work, ask [place] specifically about your restrictions, both for work and home. We will write a note to your employer if needed.  Ice packs can be placed on and off over the penis for the first 48 hours to help relieve the pain and keep the swelling down. Frozen peas or corn in a ZipLock bag can be frozen, used and re-frozen. Fifteen minutes on and 15 minutes off is a reasonable schedule.  No sexual activity for 1 month.  Hygiene:  You may shower 24 hours after your surgery. Make sure wound is clean and dry afterwards. Tub bathing should be restricted  until the seventh day.  Medication:  You will be sent home with some type of pain medication. In many cases you will be sent home with a narcotic pain pill (Vicodin or Tylox). If the pain is not too bad, you may take either Tylenol (acetaminophen) or Advil (ibuprofen) which contain no narcotic agents, and might be tolerated a little better, with fewer side effects. If the pain medication you are sent home with does not control the pain, you will have to let us know. Some narcotic pain medications cannot be given or refilled by a phone call to a pharmacy.  Problems you should report to [place]:   Fever of 101.0 degrees Fahrenheit or greater.  Moderate or severe swelling under the skin incision or involving the scrotum.  Drug reaction such as hives, a rash, nausea or vomiting.   Difficulty voiding    Post Anesthesia Home Care Instructions  Activity: Get plenty of rest for the remainder of the day. A responsible adult should stay with you for 24 hours following the procedure.  For the next 24 hours, DO NOT: -Drive a car -Paediatric nurse -Drink alcoholic beverages -Take any medication unless instructed by your physician -Make any legal decisions or sign important papers.  Meals: Start with liquid foods such as gelatin or soup. Progress to regular foods as tolerated. Avoid greasy, spicy, heavy foods. If nausea and/or vomiting occur, drink only clear liquids until the nausea and/or vomiting subsides. Call your physician if vomiting continues.  Special Instructions/Symptoms: Your throat may feel dry or sore  from the anesthesia or the breathing tube placed in your throat during surgery. If this causes discomfort, gargle with warm salt water. The discomfort should disappear within 24 hours.  If you had a scopolamine patch placed behind your ear for the management of post- operative nausea and/or vomiting:  1. The medication in the patch is effective for 72 hours, after which it should be  removed.  Wrap patch in a tissue and discard in the trash. Wash hands thoroughly with soap and water. 2. You may remove the patch earlier than 72 hours if you experience unpleasant side effects which may include dry mouth, dizziness or visual disturbances. 3. Avoid touching the patch. Wash your hands with soap and water after contact with the patch.

## 2019-06-29 ENCOUNTER — Encounter (HOSPITAL_BASED_OUTPATIENT_CLINIC_OR_DEPARTMENT_OTHER): Payer: Self-pay | Admitting: Urology

## 2019-07-16 DIAGNOSIS — N471 Phimosis: Secondary | ICD-10-CM | POA: Diagnosis not present

## 2021-10-22 ENCOUNTER — Telehealth: Payer: BC Managed Care – PPO | Admitting: Emergency Medicine

## 2021-10-22 DIAGNOSIS — M545 Low back pain, unspecified: Secondary | ICD-10-CM

## 2021-10-22 MED ORDER — NAPROXEN 500 MG PO TABS: 500.0000 mg | ORAL_TABLET | Freq: Two times a day (BID) | ORAL | 0 refills | Status: DC

## 2021-10-22 MED ORDER — CYCLOBENZAPRINE HCL 10 MG PO TABS: 10.0000 mg | ORAL_TABLET | Freq: Three times a day (TID) | ORAL | 0 refills | Status: AC | PRN

## 2021-10-22 NOTE — Progress Notes (Signed)

## 2021-10-22 NOTE — Progress Notes (Signed)
I have spent 5 minutes in review of e-visit questionnaire, review and updating patient chart, medical decision making and response to patient.   Kalyn Hofstra, PA-C    

## 2021-11-30 ENCOUNTER — Telehealth: Payer: Self-pay | Admitting: Physician Assistant

## 2021-11-30 DIAGNOSIS — M109 Gout, unspecified: Secondary | ICD-10-CM

## 2021-11-30 MED ORDER — COLCHICINE 0.6 MG PO TABS: ORAL_TABLET | ORAL | 0 refills | Status: DC

## 2021-11-30 NOTE — Progress Notes (Signed)
I have spent 5 minutes in review of e-visit questionnaire, review and updating patient chart, medical decision making and response to patient.   Philbert Ocallaghan Cody Daryn Hicks, PA-C    

## 2021-11-30 NOTE — Progress Notes (Signed)
E-Visit for Gout Symptoms  We are sorry that you are not feeling well. We are here to help!  Based on what you shared with me it looks like you have a flare of your gout.  Gout is a form of arthritis. It can cause pain and swelling in the joints. At first, it tends to affect only 1 joint - most frequently the big toe. It happens in people who have too much uric acid in the blood. Uric acid is a chemical that is produced when the body breaks down certain foods. Uric acid can form sharp needle-like crystals that build up in the joints and cause pain. Uric acid crystals can also form inside the tubes that carry urine from the kidneys to the bladder. These crystals can turn into "kidney stones" that can cause pain and problems with the flow of urine. People with gout get sudden "flares" or attacks of severe pain, most often the big toe, ankle, or knee. Often the joint also turns red and swells. Usually, only 1 joint is affected, but some people have pain in more than 1 joint. Gout flares tend to happen more often during the night.  The pain from gout can be extreme. The pain and swelling are worst at the beginning of a gout flare. The symptoms then get better within a few days to weeks. It is not clear how the body "turns off" a gout flare.  Do not start any NEW preventative medicine until the gout has cleared completely. However, If you are already on Probenecid or Allopurinol for CHRONIC gout, you may continue taking this during an active flare up  I have prescribed Colchicine 0.6 mg tabs - Take 2 tabs immediately, then 1 tab twice per day for the duration of the flare up to a max of 7 days (but discontinue for stomach pains or diarrhea)    HOME CARE Losing weight can help relieve gout. It's not clear that following a specific diet plan will help with gout symptoms but eating a balanced diet can help improve your overall health. It can also help you lose weight, if you are overweight. In general, a  healthy diet includes plenty of fruits, vegetables, whole grains, and low-fat dairy products (labelled "low fat", skim, 2%). Avoid sugar sweetened drinks (including sodas, tea, juice and juice blends, coffee drinks and sports drinks) Limit alcohol to 1-2 drinks of beer, spirits or wine daily these can make gout flares worse. Some people with gout also have other health problems, such as heart disease, high blood pressure, kidney disease, or obesity. If you have any of these issues, it's important to work with your doctor to manage them. This can help improve your overall health and might also help with your gout.  GET HELP RIGHT AWAY IF: Your symptoms persist after you have completed your treatment plan You develop severe diarrhea You develop abnormal sensations  You develop vomiting,   You develop weakness  You develop abdominal pain  FOLLOW UP WITH YOUR PRIMARY PROVIDER IF: If your symptoms do not improve within 10 days  MAKE SURE YOU  Understand these instructions. Will watch your condition. Will get help right away if you are not doing well or get worse.  Thank you for choosing an e-visit.  Your e-visit answers were reviewed by a board certified advanced clinical practitioner to complete your personal care plan. Depending upon the condition, your plan could have included both over the counter or prescription medications.  Please review your   pharmacy choice. Make sure the pharmacy is open so you can pick up prescription now. If there is a problem, you may contact your provider through MyChart messaging and have the prescription routed to another pharmacy.  Your safety is important to us. If you have drug allergies check your prescription carefully.   For the next 24 hours you can use MyChart to ask questions about today's visit, request a non-urgent call back, or ask for a work or school excuse. You will get an email in the next two days asking about your experience. I hope that your  e-visit has been valuable and will speed your recovery.  

## 2022-02-14 ENCOUNTER — Telehealth: Payer: Self-pay | Admitting: Physician Assistant

## 2022-02-14 DIAGNOSIS — M10071 Idiopathic gout, right ankle and foot: Secondary | ICD-10-CM

## 2022-02-14 DIAGNOSIS — M109 Gout, unspecified: Secondary | ICD-10-CM

## 2022-02-14 MED ORDER — COLCHICINE 0.6 MG PO TABS: ORAL_TABLET | ORAL | 0 refills | Status: AC

## 2022-02-14 NOTE — Progress Notes (Signed)
E-Visit for Gout Symptoms  We are sorry that you are not feeling well. We are here to help!  Based on what you shared with me it looks like you have a flare of your gout.  Gout is a form of arthritis. It can cause pain and swelling in the joints. At first, it tends to affect only 1 joint - most frequently the big toe. It happens in people who have too much uric acid in the blood. Uric acid is a chemical that is produced when the body breaks down certain foods. Uric acid can form sharp needle-like crystals that build up in the joints and cause pain. Uric acid crystals can also form inside the tubes that carry urine from the kidneys to the bladder. These crystals can turn into "kidney stones" that can cause pain and problems with the flow of urine. People with gout get sudden "flares" or attacks of severe pain, most often the big toe, ankle, or knee. Often the joint also turns red and swells. Usually, only 1 joint is affected, but some people have pain in more than 1 joint. Gout flares tend to happen more often during the night.  The pain from gout can be extreme. The pain and swelling are worst at the beginning of a gout flare. The symptoms then get better within a few days to weeks. It is not clear how the body "turns off" a gout flare.  Do not start any NEW preventative medicine until the gout has cleared completely. However, If you are already on Probenecid or Allopurinol for CHRONIC gout, you may continue taking this during an active flare up  I have prescribed Colchicine 0.6 mg tabs - Take 2 tabs immediately, then 1 tab twice per day for the duration of the flare up to a max of 7 days (but discontinue for stomach pains or diarrhea)    HOME CARE Losing weight can help relieve gout. It's not clear that following a specific diet plan will help with gout symptoms but eating a balanced diet can help improve your overall health. It can also help you lose weight, if you are overweight. In general, a  healthy diet includes plenty of fruits, vegetables, whole grains, and low-fat dairy products (labelled "low fat", skim, 2%). Avoid sugar sweetened drinks (including sodas, tea, juice and juice blends, coffee drinks and sports drinks) Limit alcohol to 1-2 drinks of beer, spirits or wine daily these can make gout flares worse. Some people with gout also have other health problems, such as heart disease, high blood pressure, kidney disease, or obesity. If you have any of these issues, it's important to work with your doctor to manage them. This can help improve your overall health and might also help with your gout.  GET HELP RIGHT AWAY IF: Your symptoms persist after you have completed your treatment plan You develop severe diarrhea You develop abnormal sensations  You develop vomiting,   You develop weakness  You develop abdominal pain  FOLLOW UP WITH YOUR PRIMARY PROVIDER IF: If your symptoms do not improve within 10 days  MAKE SURE YOU  Understand these instructions. Will watch your condition. Will get help right away if you are not doing well or get worse.  Thank you for choosing an e-visit.  Your e-visit answers were reviewed by a board certified advanced clinical practitioner to complete your personal care plan. Depending upon the condition, your plan could have included both over the counter or prescription medications.  Please review your   pharmacy choice. Make sure the pharmacy is open so you can pick up prescription now. If there is a problem, you may contact your provider through MyChart messaging and have the prescription routed to another pharmacy.  Your safety is important to us. If you have drug allergies check your prescription carefully.   For the next 24 hours you can use MyChart to ask questions about today's visit, request a non-urgent call back, or ask for a work or school excuse. You will get an email in the next two days asking about your experience. I hope that your  e-visit has been valuable and will speed your recovery.  I provided 5 minutes of non face-to-face time during this encounter for chart review and documentation.   

## 2023-02-13 ENCOUNTER — Encounter: Payer: Self-pay | Admitting: Emergency Medicine

## 2023-02-13 ENCOUNTER — Ambulatory Visit: Payer: BC Managed Care – PPO | Admitting: Emergency Medicine

## 2023-02-13 VITALS — BP 124/80 | HR 70 | Temp 98.4°F | Ht 75.0 in | Wt 316.0 lb

## 2023-02-13 DIAGNOSIS — Z130 Encounter for screening for diseases of the blood and blood-forming organs and certain disorders involving the immune mechanism: Secondary | ICD-10-CM

## 2023-02-13 DIAGNOSIS — Z1211 Encounter for screening for malignant neoplasm of colon: Secondary | ICD-10-CM | POA: Diagnosis not present

## 2023-02-13 DIAGNOSIS — Z125 Encounter for screening for malignant neoplasm of prostate: Secondary | ICD-10-CM

## 2023-02-13 DIAGNOSIS — Z13228 Encounter for screening for other metabolic disorders: Secondary | ICD-10-CM

## 2023-02-13 DIAGNOSIS — Z1329 Encounter for screening for other suspected endocrine disorder: Secondary | ICD-10-CM | POA: Diagnosis not present

## 2023-02-13 DIAGNOSIS — Z114 Encounter for screening for human immunodeficiency virus [HIV]: Secondary | ICD-10-CM

## 2023-02-13 DIAGNOSIS — Z7689 Persons encountering health services in other specified circumstances: Secondary | ICD-10-CM

## 2023-02-13 DIAGNOSIS — Z0000 Encounter for general adult medical examination without abnormal findings: Secondary | ICD-10-CM | POA: Diagnosis not present

## 2023-02-13 DIAGNOSIS — Z13220 Encounter for screening for lipoid disorders: Secondary | ICD-10-CM

## 2023-02-13 LAB — CBC WITH DIFFERENTIAL/PLATELET
Basophils Absolute: 0.1 10*3/uL (ref 0.0–0.1)
Basophils Relative: 0.7 % (ref 0.0–3.0)
Eosinophils Absolute: 0.2 10*3/uL (ref 0.0–0.7)
Eosinophils Relative: 1.6 % (ref 0.0–5.0)
HCT: 45.1 % (ref 39.0–52.0)
Hemoglobin: 15.4 g/dL (ref 13.0–17.0)
Lymphocytes Relative: 26 % (ref 12.0–46.0)
Lymphs Abs: 2.7 10*3/uL (ref 0.7–4.0)
MCHC: 34.1 g/dL (ref 30.0–36.0)
MCV: 90.5 fl (ref 78.0–100.0)
Monocytes Absolute: 0.9 10*3/uL (ref 0.1–1.0)
Monocytes Relative: 9 % (ref 3.0–12.0)
Neutro Abs: 6.4 10*3/uL (ref 1.4–7.7)
Neutrophils Relative %: 62.7 % (ref 43.0–77.0)
Platelets: 203 10*3/uL (ref 150.0–400.0)
RBC: 4.98 Mil/uL (ref 4.22–5.81)
RDW: 13.7 % (ref 11.5–15.5)
WBC: 10.2 10*3/uL (ref 4.0–10.5)

## 2023-02-13 LAB — LIPID PANEL
Cholesterol: 232 mg/dL — ABNORMAL HIGH (ref 0–200)
HDL: 40 mg/dL (ref >39.00)
NonHDL: 192.11
Total CHOL/HDL Ratio: 6
Triglycerides: 287 mg/dL — ABNORMAL HIGH (ref 0.0–149.0)
VLDL: 57.4 mg/dL — ABNORMAL HIGH (ref 0.0–40.0)

## 2023-02-13 LAB — COMPREHENSIVE METABOLIC PANEL
ALT: 55 U/L — ABNORMAL HIGH (ref 0–53)
AST: 27 U/L (ref 0–37)
Albumin: 4.7 g/dL (ref 3.5–5.2)
Alkaline Phosphatase: 62 U/L (ref 39–117)
BUN: 14 mg/dL (ref 6–23)
CO2: 30 mEq/L (ref 19–32)
Calcium: 9.6 mg/dL (ref 8.4–10.5)
Chloride: 104 mEq/L (ref 96–112)
Creatinine, Ser: 0.94 mg/dL (ref 0.40–1.50)
GFR: 91.14 mL/min (ref >60.00)
Glucose, Bld: 88 mg/dL (ref 70–99)
Potassium: 4.5 mEq/L (ref 3.5–5.1)
Sodium: 140 mEq/L (ref 135–145)
Total Bilirubin: 0.5 mg/dL (ref 0.2–1.2)
Total Protein: 7.1 g/dL (ref 6.0–8.3)

## 2023-02-13 LAB — TSH: TSH: 3.17 u[IU]/mL (ref 0.35–5.50)

## 2023-02-13 LAB — PSA: PSA: 0.57 ng/mL (ref 0.10–4.00)

## 2023-02-13 LAB — LDL CHOLESTEROL, DIRECT: Direct LDL: 152 mg/dL

## 2023-02-13 LAB — HEMOGLOBIN A1C: Hgb A1c MFr Bld: 6.1 % (ref 4.6–6.5)

## 2023-02-13 NOTE — Progress Notes (Signed)
Antonio Mccullough 57 y.o.   Chief Complaint  Patient presents with   Establish Care    Concerns about pain in both feet     HISTORY OF PRESENT ILLNESS: This is a 57 y.o. male here to establish care with me.  Requesting physical. Also complaining of chronic pain and swelling to both feet No other complaints or medical concerns today General health self grade: B- due to being overweight and chronic pain and swelling to his feet  HPI   Prior to Admission medications   Medication Sig Start Date End Date Taking? Authorizing Provider  colchicine 0.6 MG tablet Day1: Take 2 tablets by mouth once. Then take 1 tablet an hour later. Day 2 on, Take 1 tablet twice daily. 02/14/22  Yes Mar Daring, PA-C  cyclobenzaprine (FLEXERIL) 10 MG tablet Take 1 tablet (10 mg total) by mouth 3 (three) times daily as needed for muscle spasms. 10/22/21  Yes Wurst, [place], PA-C  ibuprofen (ADVIL) 200 MG tablet Take 800 mg by mouth every 6 (six) hours as needed.   Yes [provider]  naproxen (NAPROSYN) 500 MG tablet Take 1 tablet (500 mg total) by mouth 2 (two) times daily with a meal. 10/22/21  Yes Wurst, [place], PA-C  calcium carbonate (TUMS - DOSED IN MG ELEMENTAL CALCIUM) 500 MG chewable tablet Chew 1 tablet by mouth as needed for indigestion or heartburn. Patient not taking: Reported on 02/13/2023    [provider]    No Known Allergies  Patient Active Problem List   Diagnosis Date Noted   Obstructive sleep apnea 06/24/2007    Past Medical History:  Diagnosis Date   GERD (gastroesophageal reflux disease)    History of gout    06-23-2019  per pt last episode 2018   History of kidney stones    Phimosis     Past Surgical History:  Procedure Laterality Date   CIRCUMCISION N/A 06/26/2019   Procedure: CIRCUMCISION ADULT;  Surgeon: Franchot Gallo, MD;  Location: [facility];  Service: Urology;  Laterality: N/A;  50 MINS   NO PAST SURGERIES     WISDOM TOOTH  EXTRACTION      Social History   Socioeconomic History   Marital status: Married    Spouse name: Not on file   Number of children: Not on file   Years of education: Not on file   Highest education level: Not on file  Occupational History   Not on file  Tobacco Use   Smoking status: Never   Smokeless tobacco: Never  Vaping Use   Vaping Use: Never used  Substance and Sexual Activity   Alcohol use: Yes    Alcohol/week: 14.0 standard drinks of alcohol    Types: 14 Cans of beer per week    Comment: 2 beer daily   Drug use: Never   Sexual activity: Not on file  Other Topics Concern   Not on file  Social History Narrative   Not on file   Social Determinants of Health   Financial Resource Strain: Not on file  Food Insecurity: Not on file  Transportation Needs: Not on file  Physical Activity: Not on file  Stress: Not on file  Social Connections: Not on file  Intimate Partner Violence: Not on file    History reviewed. No pertinent family history.   Review of Systems  Constitutional: Negative.  Negative for chills and fever.  HENT: Negative.  Negative for congestion and sore throat.   Respiratory: Negative.  Negative for cough and shortness of breath.   Cardiovascular: Negative.  Negative for chest pain and palpitations.  Gastrointestinal:  Negative for abdominal pain, diarrhea, nausea and vomiting.  Genitourinary: Negative.  Negative for dysuria and hematuria.  Skin: Negative.  Negative for rash.  Neurological: Negative.  Negative for dizziness and headaches.  All other systems reviewed and are negative.   Vitals:   02/13/23 1507  BP: 124/80  Pulse: 70  Temp: 98.4 F (36.9 C)  SpO2: 98%    Physical Exam Vitals reviewed.  Constitutional:      Appearance: Normal appearance.  HENT:     Head: Normocephalic.     Right Ear: Tympanic membrane, ear canal and external ear normal.     Left Ear: Tympanic membrane, ear canal and external ear normal.      Mouth/Throat:     Mouth: Mucous membranes are moist.     Pharynx: Oropharynx is clear.  Eyes:     Extraocular Movements: Extraocular movements intact.     Conjunctiva/sclera: Conjunctivae normal.     Pupils: Pupils are equal, round, and reactive to light.  Cardiovascular:     Rate and Rhythm: Normal rate and regular rhythm.     Pulses: Normal pulses.     Heart sounds: Normal heart sounds.  Pulmonary:     Effort: Pulmonary effort is normal.     Breath sounds: Normal breath sounds.  Abdominal:     Palpations: Abdomen is soft.     Tenderness: There is no abdominal tenderness.  Musculoskeletal:     Cervical back: No tenderness.     Comments: Ankle and feet: Warm to touch.  Excellent peripheral circulation.  Good distal sensation.  Excellent capillary refill.  Mild swelling left worse than right  Lymphadenopathy:     Cervical: No cervical adenopathy.  Skin:    General: Skin is warm and dry.     Capillary Refill: Capillary refill takes less than 2 seconds.  Neurological:     General: No focal deficit present.     Mental Status: He is alert and oriented to person, place, and time.  Psychiatric:        Mood and Affect: Mood normal.        Behavior: Behavior normal.      ASSESSMENT & PLAN: Problem List Items Addressed This Visit   None Visit Diagnoses     Routine general medical examination at a health care facility    -  Primary   Relevant Orders   CBC with Differential   Comprehensive metabolic panel   Hemoglobin A1c   Lipid panel   PSA(Must document that pt has been informed of limitations of PSA testing.)   TSH   HIV antibody   Ambulatory referral to Gastroenterology   Prostate cancer screening       Relevant Orders   PSA(Must document that pt has been informed of limitations of PSA testing.)   Screening for HIV (human immunodeficiency virus)       Relevant Orders   HIV antibody   Colon cancer screening       Relevant Orders   Ambulatory referral to  Gastroenterology   Screening for deficiency anemia       Relevant Orders   CBC with Differential   Screening for lipoid disorders       Relevant Orders   Lipid panel   Screening for endocrine, metabolic and immunity disorder       Relevant Orders   Comprehensive metabolic panel  Hemoglobin A1c   TSH   HIV antibody   Encounter to establish care          Modifiable risk factors discussed with patient. Anticipatory guidance according to age provided. The following topics were also discussed: Social Determinants of Health Smoking.  Non-smoker Diet and nutrition and need to decrease amount of daily carbohydrate intake and daily calories and increase amount of plant-based protein in his diet Benefits of exercise Cancer screening and need for colon cancer screening with colonoscopy Vaccinations reviewed and recommendations Cardiovascular risk assessment and need for blood work Mental health including depression and anxiety Fall and accident prevention  Patient Instructions  Health Maintenance, Male Adopting a healthy lifestyle and getting preventive care are important in promoting health and wellness. Ask your health care provider about: The right schedule for you to have regular tests and exams. Things you can do on your own to prevent diseases and keep yourself healthy. What should I know about diet, weight, and exercise? Eat a healthy diet  Eat a diet that includes plenty of vegetables, fruits, low-fat dairy products, and lean protein. Do not eat a lot of foods that are high in solid fats, added sugars, or sodium. Maintain a healthy weight Body mass index (BMI) is a measurement that can be used to identify possible weight problems. It estimates body fat based on height and weight. Your health care provider can help determine your BMI and help you achieve or maintain a healthy weight. Get regular exercise Get regular exercise. This is one of the most important things you can do  for your health. Most adults should: Exercise for at least 150 minutes each week. The exercise should increase your heart rate and make you sweat (moderate-intensity exercise). Do strengthening exercises at least twice a week. This is in addition to the moderate-intensity exercise. Spend less time sitting. Even light physical activity can be beneficial. Watch cholesterol and blood lipids Have your blood tested for lipids and cholesterol at 56 years of age, then have this test every 5 years. You may need to have your cholesterol levels checked more often if: Your lipid or cholesterol levels are high. You are older than 56 years of age. You are at high risk for heart disease. What should I know about cancer screening? Many types of cancers can be detected early and may often be prevented. Depending on your health history and family history, you may need to have cancer screening at various ages. This may include screening for: Colorectal cancer. Prostate cancer. Skin cancer. Lung cancer. What should I know about heart disease, diabetes, and high blood pressure? Blood pressure and heart disease High blood pressure causes heart disease and increases the risk of stroke. This is more likely to develop in people who have high blood pressure readings or are overweight. Talk with your health care provider about your target blood pressure readings. Have your blood pressure checked: Every 3-5 years if you are 5-30 years of age. Every year if you are 59 years old or older. If you are between the ages of 67 and 63 and are a current or former smoker, ask your health care provider if you should have a one-time screening for abdominal aortic aneurysm (AAA). Diabetes Have regular diabetes screenings. This checks your fasting blood sugar level. Have the screening done: Once every three years after age 76 if you are at a normal weight and have a low risk for diabetes. More often and at a younger age if you  are overweight or have a high risk for diabetes. What should I know about preventing infection? Hepatitis B If you have a higher risk for hepatitis B, you should be screened for this virus. Talk with your health care provider to find out if you are at risk for hepatitis B infection. Hepatitis C Blood testing is recommended for: Everyone born from [birth year] through 1964. Anyone with known risk factors for hepatitis C. Sexually transmitted infections (STIs) You should be screened each year for STIs, including gonorrhea and chlamydia, if: You are sexually active and are younger than 56 years of age. You are older than 56 years of age and your health care provider tells you that you are at risk for this type of infection. Your sexual activity has changed since you were last screened, and you are at increased risk for chlamydia or gonorrhea. Ask your health care provider if you are at risk. Ask your health care provider about whether you are at high risk for HIV. Your health care provider may recommend a prescription medicine to help prevent HIV infection. If you choose to take medicine to prevent HIV, you should first get tested for HIV. You should then be tested every 3 months for as long as you are taking the medicine. Follow these instructions at home: Alcohol use Do not drink alcohol if your health care provider tells you not to drink. If you drink alcohol: Limit how much you have to 0-2 drinks a day. Know how much alcohol is in your drink. In the [place] one drink equals one 12 oz bottle of beer (355 mL), one 5 oz glass of wine (148 mL), or one 1 oz glass of hard liquor (44 mL). Lifestyle Do not use any products that contain nicotine or tobacco. These products include cigarettes, chewing tobacco, and vaping devices, such as e-cigarettes. If you need help quitting, ask your health care provider. Do not use street drugs. Do not share needles. Ask your health care provider for help if you need  support or information about quitting drugs. General instructions Schedule regular health, dental, and eye exams. Stay current with your vaccines. Tell your health care provider if: You often feel depressed. You have ever been abused or do not feel safe at home. Summary Adopting a healthy lifestyle and getting preventive care are important in promoting health and wellness. Follow your health care provider's instructions about healthy diet, exercising, and getting tested or screened for diseases. Follow your health care provider's instructions on monitoring your cholesterol and blood pressure. This information is not intended to replace advice given to you by your health care provider. Make sure you discuss any questions you have with your health care provider. Document Revised: 03/20/2021 Document Reviewed: 03/20/2021 Elsevier Patient Education  [place], MD [facility]

## 2023-02-13 NOTE — Patient Instructions (Signed)
Health Maintenance, Male Adopting a healthy lifestyle and getting preventive care are important in promoting health and wellness. Ask your health care provider about: The right schedule for you to have regular tests and exams. Things you can do on your own to prevent diseases and keep yourself healthy. What should I know about diet, weight, and exercise? Eat a healthy diet  Eat a diet that includes plenty of vegetables, fruits, low-fat dairy products, and lean protein. Do not eat a lot of foods that are high in solid fats, added sugars, or sodium. Maintain a healthy weight Body mass index (BMI) is a measurement that can be used to identify possible weight problems. It estimates body fat based on height and weight. Your health care provider can help determine your BMI and help you achieve or maintain a healthy weight. Get regular exercise Get regular exercise. This is one of the most important things you can do for your health. Most adults should: Exercise for at least 150 minutes each week. The exercise should increase your heart rate and make you sweat (moderate-intensity exercise). Do strengthening exercises at least twice a week. This is in addition to the moderate-intensity exercise. Spend less time sitting. Even light physical activity can be beneficial. Watch cholesterol and blood lipids Have your blood tested for lipids and cholesterol at 56 years of age, then have this test every 5 years. You may need to have your cholesterol levels checked more often if: Your lipid or cholesterol levels are high. You are older than 56 years of age. You are at high risk for heart disease. What should I know about cancer screening? Many types of cancers can be detected early and may often be prevented. Depending on your health history and family history, you may need to have cancer screening at various ages. This may include screening for: Colorectal cancer. Prostate cancer. Skin cancer. Lung  cancer. What should I know about heart disease, diabetes, and high blood pressure? Blood pressure and heart disease High blood pressure causes heart disease and increases the risk of stroke. This is more likely to develop in people who have high blood pressure readings or are overweight. Talk with your health care provider about your target blood pressure readings. Have your blood pressure checked: Every 3-5 years if you are 18-39 years of age. Every year if you are 40 years old or older. If you are between the ages of 65 and 75 and are a current or former smoker, ask your health care provider if you should have a one-time screening for abdominal aortic aneurysm (AAA). Diabetes Have regular diabetes screenings. This checks your fasting blood sugar level. Have the screening done: Once every three years after age 45 if you are at a normal weight and have a low risk for diabetes. More often and at a younger age if you are overweight or have a high risk for diabetes. What should I know about preventing infection? Hepatitis B If you have a higher risk for hepatitis B, you should be screened for this virus. Talk with your health care provider to find out if you are at risk for hepatitis B infection. Hepatitis C Blood testing is recommended for: Everyone born from 1945 through 1965. Anyone with known risk factors for hepatitis C. Sexually transmitted infections (STIs) You should be screened each year for STIs, including gonorrhea and chlamydia, if: You are sexually active and are younger than 56 years of age. You are older than 56 years of age and your   health care provider tells you that you are at risk for this type of infection. Your sexual activity has changed since you were last screened, and you are at increased risk for chlamydia or gonorrhea. Ask your health care provider if you are at risk. Ask your health care provider about whether you are at high risk for HIV. Your health care provider  may recommend a prescription medicine to help prevent HIV infection. If you choose to take medicine to prevent HIV, you should first get tested for HIV. You should then be tested every 3 months for as long as you are taking the medicine. Follow these instructions at home: Alcohol use Do not drink alcohol if your health care provider tells you not to drink. If you drink alcohol: Limit how much you have to 0-2 drinks a day. Know how much alcohol is in your drink. In the [place] one drink equals one 12 oz bottle of beer (355 mL), one 5 oz glass of wine (148 mL), or one 1 oz glass of hard liquor (44 mL). Lifestyle Do not use any products that contain nicotine or tobacco. These products include cigarettes, chewing tobacco, and vaping devices, such as e-cigarettes. If you need help quitting, ask your health care provider. Do not use street drugs. Do not share needles. Ask your health care provider for help if you need support or information about quitting drugs. General instructions Schedule regular health, dental, and eye exams. Stay current with your vaccines. Tell your health care provider if: You often feel depressed. You have ever been abused or do not feel safe at home. Summary Adopting a healthy lifestyle and getting preventive care are important in promoting health and wellness. Follow your health care provider's instructions about healthy diet, exercising, and getting tested or screened for diseases. Follow your health care provider's instructions on monitoring your cholesterol and blood pressure. This information is not intended to replace advice given to you by your health care provider. Make sure you discuss any questions you have with your health care provider. Document Revised: 03/20/2021 Document Reviewed: 03/20/2021 Elsevier Patient Education  2023 Elsevier Inc.  

## 2023-02-14 ENCOUNTER — Other Ambulatory Visit: Payer: Self-pay | Admitting: Emergency Medicine

## 2023-02-14 ENCOUNTER — Encounter: Payer: Self-pay | Admitting: Internal Medicine

## 2023-02-14 DIAGNOSIS — E785 Hyperlipidemia, unspecified: Secondary | ICD-10-CM

## 2023-02-14 LAB — HIV ANTIBODY (ROUTINE TESTING W REFLEX): HIV 1&2 Ab, 4th Generation: NONREACTIVE

## 2023-02-14 MED ORDER — ROSUVASTATIN CALCIUM 10 MG PO TABS
10.0000 mg | ORAL_TABLET | Freq: Every day | ORAL | 3 refills | Status: AC
Start: 2023-02-14 — End: ?

## 2023-03-14 ENCOUNTER — Encounter: Payer: Self-pay | Admitting: Internal Medicine

## 2023-03-14 ENCOUNTER — Ambulatory Visit (AMBULATORY_SURGERY_CENTER): Payer: BC Managed Care – PPO

## 2023-03-14 VITALS — Ht 76.0 in | Wt 305.0 lb

## 2023-03-14 DIAGNOSIS — Z1211 Encounter for screening for malignant neoplasm of colon: Secondary | ICD-10-CM

## 2023-03-14 MED ORDER — NA SULFATE-K SULFATE-MG SULF 17.5-3.13-1.6 GM/177ML PO SOLN: 1.0000 | Freq: Once | ORAL | 0 refills | Status: AC

## 2023-03-14 NOTE — Progress Notes (Signed)
No egg or soy allergy known to patient  No issues known to pt with past sedation with any surgeries or procedures Patient denies ever being told they had issues or difficulty with intubation  No FH of Malignant Hyperthermia Pt is not on diet pills Pt is not on  home 02  Pt is not on blood thinners  Pt denies issues with constipation  No A fib or A flutter Have any cardiac testing pending--no Pt instructed to use Singlecare.com or GoodRx for a price reduction on prep   Patient's chart reviewed by Cathlyn Parsons CNRA prior to previsit and patient appropriate for the [facility].  Previsit completed and red dot placed by patient's name on their procedure day (on provider's schedule).    Cam ambulate without assistance

## 2023-03-21 ENCOUNTER — Telehealth: Payer: BC Managed Care – PPO | Admitting: Nurse Practitioner

## 2023-03-21 DIAGNOSIS — M10072 Idiopathic gout, left ankle and foot: Secondary | ICD-10-CM | POA: Diagnosis not present

## 2023-03-21 MED ORDER — INDOMETHACIN 50 MG PO CAPS
50.0000 mg | ORAL_CAPSULE | Freq: Three times a day (TID) | ORAL | 0 refills | Status: AC
Start: 2023-03-21 — End: 2023-03-28

## 2023-03-21 NOTE — Progress Notes (Signed)
E-Visit for Gout Symptoms  We are sorry that you are not feeling well. We are here to help!  Based on what you shared with me it looks like you have a flare of your gout.  Gout is a form of arthritis. It can cause pain and swelling in the joints. At first, it tends to affect only 1 joint - most frequently the big toe. It happens in people who have too much uric acid in the blood. Uric acid is a chemical that is produced when the body breaks down certain foods. Uric acid can form sharp needle-like crystals that build up in the joints and cause pain. Uric acid crystals can also form inside the tubes that carry urine from the kidneys to the bladder. These crystals can turn into "kidney stones" that can cause pain and problems with the flow of urine. People with gout get sudden "flares" or attacks of severe pain, most often the big toe, ankle, or knee. Often the joint also turns red and swells. Usually, only 1 joint is affected, but some people have pain in more than 1 joint. Gout flares tend to happen more often during the night.  The pain from gout can be extreme. The pain and swelling are worst at the beginning of a gout flare. The symptoms then get better within a few days to weeks. It is not clear how the body "turns off" a gout flare.  Do not start any NEW preventative medicine until the gout has cleared completely. However, If you are already on Probenecid or Allopurinol for CHRONIC gout, you may continue taking this during an active flare up  I have prescribed Indomethacin '50mg'$  three times daily for moderate to severe pain for no more than 7 days   HOME CARE Losing weight can help relieve gout. It's not clear that following a specific diet plan will help with gout symptoms but eating a balanced diet can help improve your overall health. It can also help you lose weight, if you are overweight. In general, a healthy diet includes plenty of fruits, vegetables, whole grains, and low-fat dairy  products (labelled "low fat", skim, 2%). Avoid sugar sweetened drinks (including sodas, tea, juice and juice blends, coffee drinks and sports drinks) Limit alcohol to 1-2 drinks of beer, spirits or wine daily these can make gout flares worse. Some people with gout also have other health problems, such as heart disease, high blood pressure, kidney disease, or obesity. If you have any of these issues, it's important to work with your doctor to manage them. This can help improve your overall health and might also help with your gout.  GET HELP RIGHT AWAY IF: Your symptoms persist after you have completed your treatment plan You develop severe diarrhea You develop abnormal sensations  You develop vomiting,   You develop weakness  You develop abdominal pain  FOLLOW UP WITH YOUR PRIMARY PROVIDER IF: If your symptoms do not improve within 10 days  MAKE SURE YOU  Understand these instructions. Will watch your condition. Will get help right away if you are not doing well or get worse.  Thank you for choosing an e-visit.  Your e-visit answers were reviewed by a board certified advanced clinical practitioner to complete your personal care plan. Depending upon the condition, your plan could have included both over the counter or prescription medications.  Please review your pharmacy choice. Make sure the pharmacy is open so you can pick up prescription now. If there is a problem,  you may contact your provider through [employer] and have the prescription routed to another pharmacy.  Your safety is important to [place]. If you have drug allergies check your prescription carefully.   For the next 24 hours you can use MyChart to ask questions about today's visit, request a non-urgent call back, or ask for a work or school excuse. You will get an email in the next two days asking about your experience. I hope that your e-visit has been valuable and will speed your recovery.   Meds ordered this encounter   Medications   indomethacin (INDOCIN) 50 MG capsule    Sig: Take 1 capsule (50 mg total) by mouth 3 (three) times daily with meals for 7 days.    Dispense:  21 capsule    Refill:  0     I spent approximately 5 minutes reviewing the patient's history, current symptoms and coordinating their care today.

## 2023-03-26 ENCOUNTER — Encounter: Payer: Self-pay | Admitting: Emergency Medicine

## 2023-03-26 NOTE — Telephone Encounter (Signed)
Unlikely Crestor is causing gout flareup. Crestor and all the statins may cause muscle and joint generalized aches.

## 2023-04-05 ENCOUNTER — Encounter: Payer: BC Managed Care – PPO | Admitting: Internal Medicine

## 2024-02-12 ENCOUNTER — Encounter: Payer: BC Managed Care – PPO | Admitting: Emergency Medicine

## 2024-02-17 ENCOUNTER — Encounter: Payer: Self-pay | Admitting: Emergency Medicine

## 2024-02-17 ENCOUNTER — Ambulatory Visit (INDEPENDENT_AMBULATORY_CARE_PROVIDER_SITE_OTHER): Admitting: Emergency Medicine

## 2024-02-17 VITALS — BP 148/90 | HR 61 | Temp 98.1°F | Ht 76.0 in | Wt 321.0 lb

## 2024-02-17 DIAGNOSIS — G4733 Obstructive sleep apnea (adult) (pediatric): Secondary | ICD-10-CM | POA: Diagnosis not present

## 2024-02-17 DIAGNOSIS — Z13228 Encounter for screening for other metabolic disorders: Secondary | ICD-10-CM

## 2024-02-17 DIAGNOSIS — R7303 Prediabetes: Secondary | ICD-10-CM | POA: Diagnosis not present

## 2024-02-17 DIAGNOSIS — R030 Elevated blood-pressure reading, without diagnosis of hypertension: Secondary | ICD-10-CM | POA: Insufficient documentation

## 2024-02-17 DIAGNOSIS — M1A0710 Idiopathic chronic gout, right ankle and foot, without tophus (tophi): Secondary | ICD-10-CM | POA: Diagnosis not present

## 2024-02-17 DIAGNOSIS — Z130 Encounter for screening for diseases of the blood and blood-forming organs and certain disorders involving the immune mechanism: Secondary | ICD-10-CM

## 2024-02-17 DIAGNOSIS — E785 Hyperlipidemia, unspecified: Secondary | ICD-10-CM | POA: Diagnosis not present

## 2024-02-17 DIAGNOSIS — Z1329 Encounter for screening for other suspected endocrine disorder: Secondary | ICD-10-CM | POA: Diagnosis not present

## 2024-02-17 DIAGNOSIS — Z125 Encounter for screening for malignant neoplasm of prostate: Secondary | ICD-10-CM | POA: Diagnosis not present

## 2024-02-17 DIAGNOSIS — Z0001 Encounter for general adult medical examination with abnormal findings: Secondary | ICD-10-CM

## 2024-02-17 DIAGNOSIS — Z1211 Encounter for screening for malignant neoplasm of colon: Secondary | ICD-10-CM

## 2024-02-17 LAB — COMPREHENSIVE METABOLIC PANEL WITH GFR
ALT: 52 U/L (ref 0–53)
AST: 27 U/L (ref 0–37)
Albumin: 4.6 g/dL (ref 3.5–5.2)
Alkaline Phosphatase: 64 U/L (ref 39–117)
BUN: 17 mg/dL (ref 6–23)
CO2: 23 meq/L (ref 19–32)
Calcium: 9.4 mg/dL (ref 8.4–10.5)
Chloride: 104 meq/L (ref 96–112)
Creatinine, Ser: 0.88 mg/dL (ref 0.40–1.50)
GFR: 95.99 mL/min (ref >60.00)
Glucose, Bld: 94 mg/dL (ref 70–99)
Potassium: 4.2 meq/L (ref 3.5–5.1)
Sodium: 138 meq/L (ref 135–145)
Total Bilirubin: 0.5 mg/dL (ref 0.2–1.2)
Total Protein: 7.1 g/dL (ref 6.0–8.3)

## 2024-02-17 LAB — CBC WITH DIFFERENTIAL/PLATELET
Basophils Absolute: 0.1 10*3/uL (ref 0.0–0.1)
Basophils Relative: 0.6 % (ref 0.0–3.0)
Eosinophils Absolute: 0.1 10*3/uL (ref 0.0–0.7)
Eosinophils Relative: 1.7 % (ref 0.0–5.0)
HCT: 44.6 % (ref 39.0–52.0)
Hemoglobin: 15.3 g/dL (ref 13.0–17.0)
Lymphocytes Relative: 22.8 % (ref 12.0–46.0)
Lymphs Abs: 2 10*3/uL (ref 0.7–4.0)
MCHC: 34.3 g/dL (ref 30.0–36.0)
MCV: 90.3 fl (ref 78.0–100.0)
Monocytes Absolute: 0.6 10*3/uL (ref 0.1–1.0)
Monocytes Relative: 6.6 % (ref 3.0–12.0)
Neutro Abs: 6 10*3/uL (ref 1.4–7.7)
Neutrophils Relative %: 68.3 % (ref 43.0–77.0)
Platelets: 152 10*3/uL (ref 150.0–400.0)
RBC: 4.94 Mil/uL (ref 4.22–5.81)
RDW: 13.3 % (ref 11.5–15.5)
WBC: 8.7 10*3/uL (ref 4.0–10.5)

## 2024-02-17 LAB — LIPID PANEL
Cholesterol: 224 mg/dL — ABNORMAL HIGH (ref 0–200)
HDL: 38.8 mg/dL — ABNORMAL LOW (ref >39.00)
LDL Cholesterol: 151 mg/dL — ABNORMAL HIGH (ref 0–99)
NonHDL: 185.42
Total CHOL/HDL Ratio: 6
Triglycerides: 174 mg/dL — ABNORMAL HIGH (ref 0.0–149.0)
VLDL: 34.8 mg/dL (ref 0.0–40.0)

## 2024-02-17 LAB — TSH: TSH: 3.53 u[IU]/mL (ref 0.35–5.50)

## 2024-02-17 LAB — PSA: PSA: 0.68 ng/mL (ref 0.10–4.00)

## 2024-02-17 LAB — HEMOGLOBIN A1C: Hgb A1c MFr Bld: 6.2 % (ref 4.6–6.5)

## 2024-02-17 NOTE — Assessment & Plan Note (Signed)
 Elevated blood pressure reading in the office Cardiovascular risks associated with hypertension discussed Advised to monitor blood pressure readings at home daily for the next several weeks and keep a log. Advised to contact the office if numbers persistently abnormal

## 2024-02-17 NOTE — Assessment & Plan Note (Signed)
 Diet and nutrition discussed Cardiovascular risks associated with diabetes discussed Advised to decrease amount of daily carbohydrate intake and daily calories and increase amount of plant-based protein in his diet

## 2024-02-17 NOTE — Progress Notes (Signed)
 Antonio Mccullough 58 y.o.   Chief Complaint  Patient presents with   Annual Exam    Patient here for physical. Patient is c/o of both of his feet being painful, sore, and swelling at times for about 2 years now. Has hx of Gout.    HISTORY OF PRESENT ILLNESS: This is a 59 y.o. male A1A here for annual exam. History of dyslipidemia both sensitive to statins.  Statins trigger gout. History of prediabetes Chronic pain to both feet No other complaints or medical concerns today. Wt Readings from Last 3 Encounters:  02/17/24 (!) 321 lb (145.6 kg)  03/14/23 (!) 305 lb (138.3 kg)  02/13/23 (!) 316 lb (143.3 kg)     HPI   Prior to Admission medications   Medication Sig Start Date End Date Taking? Authorizing Provider  aspirin 500 MG tablet Take 500 mg by mouth in the morning and at bedtime.   Yes [provider]  ibuprofen (ADVIL) 200 MG tablet Take 800 mg by mouth every 6 (six) hours as needed.   Yes [provider]  calcium carbonate (TUMS - DOSED IN MG ELEMENTAL CALCIUM) 500 MG chewable tablet Chew 1 tablet by mouth as needed for indigestion or heartburn. Patient not taking: Reported on 02/17/2024    [provider]  colchicine 0.6 MG tablet Day1: Take 2 tablets by mouth once. Then take 1 tablet an hour later. Day 2 on, Take 1 tablet twice daily. Patient not taking: Reported on 02/17/2024 02/14/22   Margaretann Loveless, PA-C  cyclobenzaprine (FLEXERIL) 10 MG tablet Take 1 tablet (10 mg total) by mouth 3 (three) times daily as needed for muscle spasms. Patient not taking: Reported on 02/17/2024 10/22/21   Wurst, [place], PA-C  rosuvastatin (CRESTOR) 10 MG tablet Take 1 tablet (10 mg total) by mouth daily. Patient not taking: Reported on 02/17/2024 02/14/23   Georgina Quint, MD    No Known Allergies  Patient Active Problem List   Diagnosis Date Noted   Dyslipidemia 02/14/2023   Gout 10/25/2017   Obstructive sleep apnea 06/24/2007    Past Medical History:   Diagnosis Date   GERD (gastroesophageal reflux disease)    History of gout    06-23-2019  per pt last episode 2018   History of kidney stones    Hyperlipidemia    Phimosis     Past Surgical History:  Procedure Laterality Date   CIRCUMCISION N/A 06/26/2019   Procedure: CIRCUMCISION ADULT;  Surgeon: Marcine Matar, MD;  Location: [facility];  Service: Urology;  Laterality: N/A;  45 MINS   NO PAST SURGERIES     WISDOM TOOTH EXTRACTION      Social History   Socioeconomic History   Marital status: Married    Spouse name: Not on file   Number of children: Not on file   Years of education: Not on file   Highest education level: Master's degree (e.g., MA, MS, MEng, MEd, MSW, MBA)  Occupational History   Not on file  Tobacco Use   Smoking status: Never   Smokeless tobacco: Never  Vaping Use   Vaping status: Never Used  Substance and Sexual Activity   Alcohol use: Yes    Alcohol/week: 14.0 standard drinks of alcohol    Types: 14 Cans of beer per week    Comment: 2 beer daily   Drug use: Never   Sexual activity: Not on file  Other Topics Concern   Not on file  Social History Narrative  Not on file   Social Drivers of Health   Financial Resource Strain: Low Risk  (02/11/2024)   Overall Financial Resource Strain (CARDIA)    Difficulty of Paying Living Expenses: Not hard at all  Food Insecurity: No Food Insecurity (02/11/2024)   Hunger Vital Sign    Worried About Running Out of Food in the Last Year: Never true    Ran Out of Food in the Last Year: Never true  Transportation Needs: No Transportation Needs (02/11/2024)   PRAPARE - Administrator, Civil Service (Medical): No    Lack of Transportation (Non-Medical): No  Physical Activity: Insufficiently Active (02/11/2024)   Exercise Vital Sign    Days of Exercise per Week: 3 days    Minutes of Exercise per Session: 40 min  Stress: No Stress Concern Present (02/11/2024)   [employer] of  Occupational Health - Occupational Stress Questionnaire    Feeling of Stress : Only a little  Social Connections: Socially Isolated (02/11/2024)   Social Connection and Isolation Panel [NHANES]    Frequency of Communication with Friends and Family: Once a week    Frequency of Social Gatherings with Friends and Family: Never    Attends Religious Services: Never    Diplomatic Services operational officer: No    Attends Engineer, structural: Not on file    Marital Status: Married  Catering manager Violence: Not on file    Family History  Problem Relation Age of Onset   Esophageal cancer Paternal Grandfather    Colon cancer Neg Hx    Colon polyps Neg Hx    Prostate cancer Neg Hx    Rectal cancer Neg Hx      Review of Systems  Constitutional: Negative.  Negative for chills and fever.  HENT: Negative.  Negative for congestion and sore throat.   Respiratory: Negative.  Negative for cough and shortness of breath.   Cardiovascular: Negative.  Negative for chest pain and palpitations.  Gastrointestinal:  Negative for abdominal pain, diarrhea, nausea and vomiting.  Genitourinary: Negative.  Negative for dysuria.  Musculoskeletal:  Positive for joint pain.  Skin: Negative.  Negative for rash.  Neurological: Negative.  Negative for dizziness and headaches.  All other systems reviewed and are negative.   Vitals:   02/17/24 0937  BP: (!) 148/90  Pulse: 61  Temp: 98.1 F (36.7 C)  SpO2: 98%    Physical Exam Constitutional:      Appearance: Normal appearance. He is obese.  HENT:     Head: Normocephalic.     Right Ear: Tympanic membrane, ear canal and external ear normal.     Left Ear: Tympanic membrane, ear canal and external ear normal.     Mouth/Throat:     Mouth: Mucous membranes are moist.     Pharynx: Oropharynx is clear.  Eyes:     Extraocular Movements: Extraocular movements intact.     Conjunctiva/sclera: Conjunctivae normal.     Pupils: Pupils are equal,  round, and reactive to light.  Cardiovascular:     Rate and Rhythm: Normal rate and regular rhythm.     Pulses: Normal pulses.     Heart sounds: Normal heart sounds.  Pulmonary:     Effort: Pulmonary effort is normal.     Breath sounds: Normal breath sounds.  Abdominal:     Palpations: Abdomen is soft.     Tenderness: There is no abdominal tenderness.  Musculoskeletal:     Cervical back: No tenderness.  Lymphadenopathy:     Cervical: No cervical adenopathy.  Skin:    General: Skin is warm and dry.     Capillary Refill: Capillary refill takes less than 2 seconds.  Neurological:     General: No focal deficit present.     Mental Status: He is alert and oriented to person, place, and time.  Psychiatric:        Mood and Affect: Mood normal.        Behavior: Behavior normal.      ASSESSMENT & PLAN: Problem List Items Addressed This Visit       Respiratory   Obstructive sleep apnea   Presently not on CPAP treatment        Other   Gout   Dyslipidemia   Intolerant to statins Exacerbates gout flareups      Relevant Orders   Comprehensive metabolic panel with GFR   Lipid panel   Elevated blood-pressure reading, without diagnosis of hypertension   Elevated blood pressure reading in the office Cardiovascular risks associated with hypertension discussed Advised to monitor blood pressure readings at home daily for the next several weeks and keep a log.  Advised to contact the office if numbers persistently abnormal      Prediabetes   Diet and nutrition discussed Cardiovascular risks associated with diabetes discussed Advised to decrease amount of daily carbohydrate intake and daily calories and increase amount of plant-based protein in his diet      Relevant Orders   Hemoglobin A1c   Morbid obesity (HCC)   Cardiovascular risks associated with obesity discussed Diet and nutrition discussed Benefits of exercise discussed Advised to decrease amount of daily  carbohydrate intake and daily calories and increase amount of plant-based protein in his diet      Other Visit Diagnoses       Encounter for general adult medical examination with abnormal findings    -  Primary   Relevant Orders   CBC with Differential/Platelet   Comprehensive metabolic panel with GFR   Hemoglobin A1c   Lipid panel   TSH   PSA     Colon cancer screening       Relevant Orders   Ambulatory referral to Gastroenterology     Screening for deficiency anemia       Relevant Orders   CBC with Differential/Platelet     Screening for endocrine, metabolic and immunity disorder       Relevant Orders   Comprehensive metabolic panel with GFR   TSH     Screening for prostate cancer       Relevant Orders   PSA      Modifiable risk factors discussed with patient. Anticipatory guidance according to age provided. The following topics were also discussed: Social Determinants of Health Smoking.  Non-smoker Diet and nutrition Benefits of exercise Cancer screening and need for colon cancer screening with colonoscopy Vaccinations review and recommendations Cardiovascular risk assessment and need for blood work The 10-year ASCVD risk score (Arnett DK, et al., 2019) is: 11.1%   Values used to calculate the score:     Age: 24 years     Sex: Male     Is Non-Hispanic African American: No     Diabetic: No     Tobacco smoker: No     Systolic Blood Pressure: 148 mmHg     Is BP treated: No     HDL Cholesterol: 40 mg/dL     Total Cholesterol: 232 mg/dL Review of chronic medical conditions  under management Review of all medications Mental health including depression and anxiety Fall and accident prevention  Patient Instructions  Health Maintenance, Male Adopting a healthy lifestyle and getting preventive care are important in promoting health and wellness. Ask your health care provider about: The right schedule for you to have regular tests and exams. Things you can do on  your own to prevent diseases and keep yourself healthy. What should I know about diet, weight, and exercise? Eat a healthy diet  Eat a diet that includes plenty of vegetables, fruits, low-fat dairy products, and lean protein. Do not eat a lot of foods that are high in solid fats, added sugars, or sodium. Maintain a healthy weight Body mass index (BMI) is a measurement that can be used to identify possible weight problems. It estimates body fat based on height and weight. Your health care provider can help determine your BMI and help you achieve or maintain a healthy weight. Get regular exercise Get regular exercise. This is one of the most important things you can do for your health. Most adults should: Exercise for at least 150 minutes each week. The exercise should increase your heart rate and make you sweat (moderate-intensity exercise). Do strengthening exercises at least twice a week. This is in addition to the moderate-intensity exercise. Spend less time sitting. Even light physical activity can be beneficial. Watch cholesterol and blood lipids Have your blood tested for lipids and cholesterol at 57 years of age, then have this test every 5 years. You may need to have your cholesterol levels checked more often if: Your lipid or cholesterol levels are high. You are older than 57 years of age. You are at high risk for heart disease. What should I know about cancer screening? Many types of cancers can be detected early and may often be prevented. Depending on your health history and family history, you may need to have cancer screening at various ages. This may include screening for: Colorectal cancer. Prostate cancer. Skin cancer. Lung cancer. What should I know about heart disease, diabetes, and high blood pressure? Blood pressure and heart disease High blood pressure causes heart disease and increases the risk of stroke. This is more likely to develop in people who have high blood  pressure readings or are overweight. Talk with your health care provider about your target blood pressure readings. Have your blood pressure checked: Every 3-5 years if you are 53-45 years of age. Every year if you are 44 years old or older. If you are between the ages of 55 and 70 and are a current or former smoker, ask your health care provider if you should have a one-time screening for abdominal aortic aneurysm (AAA). Diabetes Have regular diabetes screenings. This checks your fasting blood sugar level. Have the screening done: Once every three years after age 45 if you are at a normal weight and have a low risk for diabetes. More often and at a younger age if you are overweight or have a high risk for diabetes. What should I know about preventing infection? Hepatitis B If you have a higher risk for hepatitis B, you should be screened for this virus. Talk with your health care provider to find out if you are at risk for hepatitis B infection. Hepatitis C Blood testing is recommended for: Everyone born from [birth year] through 1964. Anyone with known risk factors for hepatitis C. Sexually transmitted infections (STIs) You should be screened each year for STIs, including gonorrhea and chlamydia, if: You  are sexually active and are younger than 57 years of age. You are older than 57 years of age and your health care provider tells you that you are at risk for this type of infection. Your sexual activity has changed since you were last screened, and you are at increased risk for chlamydia or gonorrhea. Ask your health care provider if you are at risk. Ask your health care provider about whether you are at high risk for HIV. Your health care provider may recommend a prescription medicine to help prevent HIV infection. If you choose to take medicine to prevent HIV, you should first get tested for HIV. You should then be tested every 3 months for as long as you are taking the medicine. Follow these  instructions at home: Alcohol use Do not drink alcohol if your health care provider tells you not to drink. If you drink alcohol: Limit how much you have to 0-2 drinks a day. Know how much alcohol is in your drink. In the [place] one drink equals one 12 oz bottle of beer (355 mL), one 5 oz glass of wine (148 mL), or one 1 oz glass of hard liquor (44 mL). Lifestyle Do not use any products that contain nicotine or tobacco. These products include cigarettes, chewing tobacco, and vaping devices, such as e-cigarettes. If you need help quitting, ask your health care provider. Do not use street drugs. Do not share needles. Ask your health care provider for help if you need support or information about quitting drugs. General instructions Schedule regular health, dental, and eye exams. Stay current with your vaccines. Tell your health care provider if: You often feel depressed. You have ever been abused or do not feel safe at home. Summary Adopting a healthy lifestyle and getting preventive care are important in promoting health and wellness. Follow your health care provider's instructions about healthy diet, exercising, and getting tested or screened for diseases. Follow your health care provider's instructions on monitoring your cholesterol and blood pressure. This information is not intended to replace advice given to you by your health care provider. Make sure you discuss any questions you have with your health care provider. Document Revised: 03/20/2021 Document Reviewed: 03/20/2021 Elsevier Patient Education  2024 Elsevier Inc.     Edwina Barth, MD [facility]

## 2024-02-17 NOTE — Assessment & Plan Note (Signed)
 Intolerant to statins Exacerbates gout flareups

## 2024-02-17 NOTE — Assessment & Plan Note (Signed)
 Presently not on CPAP treatment

## 2024-02-17 NOTE — Patient Instructions (Signed)
 Health Maintenance, Male  Adopting a healthy lifestyle and getting preventive care are important in promoting health and wellness. Ask your health care provider about:  The right schedule for you to have regular tests and exams.  Things you can do on your own to prevent diseases and keep yourself healthy.  What should I know about diet, weight, and exercise?  Eat a healthy diet    Eat a diet that includes plenty of vegetables, fruits, low-fat dairy products, and lean protein.  Do not eat a lot of foods that are high in solid fats, added sugars, or sodium.  Maintain a healthy weight  Body mass index (BMI) is a measurement that can be used to identify possible weight problems. It estimates body fat based on height and weight. Your health care provider can help determine your BMI and help you achieve or maintain a healthy weight.  Get regular exercise  Get regular exercise. This is one of the most important things you can do for your health. Most adults should:  Exercise for at least 150 minutes each week. The exercise should increase your heart rate and make you sweat (moderate-intensity exercise).  Do strengthening exercises at least twice a week. This is in addition to the moderate-intensity exercise.  Spend less time sitting. Even light physical activity can be beneficial.  Watch cholesterol and blood lipids  Have your blood tested for lipids and cholesterol at 57 years of age, then have this test every 5 years.  You may need to have your cholesterol levels checked more often if:  Your lipid or cholesterol levels are high.  You are older than 57 years of age.  You are at high risk for heart disease.  What should I know about cancer screening?  Many types of cancers can be detected early and may often be prevented. Depending on your health history and family history, you may need to have cancer screening at various ages. This may include screening for:  Colorectal cancer.  Prostate cancer.  Skin cancer.  Lung  cancer.  What should I know about heart disease, diabetes, and high blood pressure?  Blood pressure and heart disease  High blood pressure causes heart disease and increases the risk of stroke. This is more likely to develop in people who have high blood pressure readings or are overweight.  Talk with your health care provider about your target blood pressure readings.  Have your blood pressure checked:  Every 3-5 years if you are 9-95 years of age.  Every year if you are 85 years old or older.  If you are between the ages of 29 and 29 and are a current or former smoker, ask your health care provider if you should have a one-time screening for abdominal aortic aneurysm (AAA).  Diabetes  Have regular diabetes screenings. This checks your fasting blood sugar level. Have the screening done:  Once every three years after age 23 if you are at a normal weight and have a low risk for diabetes.  More often and at a younger age if you are overweight or have a high risk for diabetes.  What should I know about preventing infection?  Hepatitis B  If you have a higher risk for hepatitis B, you should be screened for this virus. Talk with your health care provider to find out if you are at risk for hepatitis B infection.  Hepatitis C  Blood testing is recommended for:  Everyone born from [birth year] through 1964.  Anyone  with known risk factors for hepatitis C.  Sexually transmitted infections (STIs)  You should be screened each year for STIs, including gonorrhea and chlamydia, if:  You are sexually active and are younger than 57 years of age.  You are older than 57 years of age and your health care provider tells you that you are at risk for this type of infection.  Your sexual activity has changed since you were last screened, and you are at increased risk for chlamydia or gonorrhea. Ask your health care provider if you are at risk.  Ask your health care provider about whether you are at high risk for HIV. Your health care provider  may recommend a prescription medicine to help prevent HIV infection. If you choose to take medicine to prevent HIV, you should first get tested for HIV. You should then be tested every 3 months for as long as you are taking the medicine.  Follow these instructions at home:  Alcohol use  Do not drink alcohol if your health care provider tells you not to drink.  If you drink alcohol:  Limit how much you have to 0-2 drinks a day.  Know how much alcohol is in your drink. In the [place] one drink equals one 12 oz bottle of beer (355 mL), one 5 oz glass of wine (148 mL), or one 1 oz glass of hard liquor (44 mL).  Lifestyle  Do not use any products that contain nicotine or tobacco. These products include cigarettes, chewing tobacco, and vaping devices, such as e-cigarettes. If you need help quitting, ask your health care provider.  Do not use street drugs.  Do not share needles.  Ask your health care provider for help if you need support or information about quitting drugs.  General instructions  Schedule regular health, dental, and eye exams.  Stay current with your vaccines.  Tell your health care provider if:  You often feel depressed.  You have ever been abused or do not feel safe at home.  Summary  Adopting a healthy lifestyle and getting preventive care are important in promoting health and wellness.  Follow your health care provider's instructions about healthy diet, exercising, and getting tested or screened for diseases.  Follow your health care provider's instructions on monitoring your cholesterol and blood pressure.  This information is not intended to replace advice given to you by your health care provider. Make sure you discuss any questions you have with your health care provider.  Document Revised: 03/20/2021 Document Reviewed: 03/20/2021  Elsevier Patient Education  2024 [employer].

## 2024-02-17 NOTE — Assessment & Plan Note (Signed)
Cardiovascular risks associated with obesity discussed Diet and nutrition discussed Benefits of exercise discussed Advised to decrease amount of daily carbohydrate intake and daily calories and increase amount of plant-based protein in his diet

## 2024-02-27 ENCOUNTER — Ambulatory Visit (AMBULATORY_SURGERY_CENTER): Admitting: *Deleted

## 2024-02-27 VITALS — Ht 76.0 in | Wt 321.0 lb

## 2024-02-27 DIAGNOSIS — Z1211 Encounter for screening for malignant neoplasm of colon: Secondary | ICD-10-CM

## 2024-02-27 MED ORDER — NA SULFATE-K SULFATE-MG SULF 17.5-3.13-1.6 GM/177ML PO SOLN: 1.0000 | Freq: Once | ORAL | 0 refills | Status: AC

## 2024-02-27 NOTE — Progress Notes (Signed)
 PV completed over telephone. Instructions forwarded through MyChart. Patient confirmed he has active MyChart and he knows how to navigate MyChart.  No egg or soy allergy known to patient  No issues known to pt with past sedation with any surgeries or procedures Patient denies ever being told they had issues or difficulty with intubation  No FH of Malignant Hyperthermia Pt is not on diet pills Pt is not on  home 02  Pt is not on blood thinners  Pt denies issues with constipation  No A fib or A flutter Have any cardiac testing pending-- NO Pt instructed to use [email or web address].com or GoodRx for a price reduction on prep

## 2024-03-06 ENCOUNTER — Encounter: Payer: Self-pay | Admitting: Internal Medicine

## 2024-03-12 ENCOUNTER — Encounter: Payer: Self-pay | Admitting: Internal Medicine

## 2024-03-12 ENCOUNTER — Ambulatory Visit: Admitting: Internal Medicine

## 2024-03-12 VITALS — BP 163/90 | HR 54 | Temp 98.2°F | Resp 12 | Ht 76.0 in | Wt 321.0 lb

## 2024-03-12 DIAGNOSIS — K648 Other hemorrhoids: Secondary | ICD-10-CM | POA: Diagnosis not present

## 2024-03-12 DIAGNOSIS — D123 Benign neoplasm of transverse colon: Secondary | ICD-10-CM

## 2024-03-12 DIAGNOSIS — D122 Benign neoplasm of ascending colon: Secondary | ICD-10-CM | POA: Diagnosis not present

## 2024-03-12 DIAGNOSIS — K5730 Diverticulosis of large intestine without perforation or abscess without bleeding: Secondary | ICD-10-CM

## 2024-03-12 DIAGNOSIS — K635 Polyp of colon: Secondary | ICD-10-CM | POA: Diagnosis not present

## 2024-03-12 DIAGNOSIS — Z1211 Encounter for screening for malignant neoplasm of colon: Secondary | ICD-10-CM | POA: Diagnosis not present

## 2024-03-12 DIAGNOSIS — D120 Benign neoplasm of cecum: Secondary | ICD-10-CM | POA: Diagnosis not present

## 2024-03-12 DIAGNOSIS — D125 Benign neoplasm of sigmoid colon: Secondary | ICD-10-CM | POA: Diagnosis not present

## 2024-03-12 DIAGNOSIS — D124 Benign neoplasm of descending colon: Secondary | ICD-10-CM | POA: Diagnosis not present

## 2024-03-12 MED ORDER — SODIUM CHLORIDE 0.9 % IV SOLN
500.0000 mL | Freq: Once | INTRAVENOUS | Status: DC
Start: 2024-03-12 — End: 2024-03-12

## 2024-03-12 NOTE — Progress Notes (Signed)
 Pt's states no medical or surgical changes since previsit or office visit.

## 2024-03-12 NOTE — Progress Notes (Signed)
 GASTROENTEROLOGY PROCEDURE H&P NOTE   Primary Care Physician: Elvira Hammersmith, MD    Reason for Procedure:   Colon cancer screening  Plan:    Colonoscopy  Patient is appropriate for endoscopic procedure(s) in the ambulatory ([facility]) setting.  The nature of the procedure, as well as the risks, benefits, and alternatives were carefully and thoroughly reviewed with the patient. Ample time for discussion and questions allowed. The patient understood, was satisfied, and agreed to proceed.     HPI: Antonio Mccullough is a 58 y.o. male who presents for colonoscopy for colon cancer screening. Denies blood in stools, changes in bowel habits, or unintentional weight loss. Denies family history of colon cancer. Had a remote colonoscopy done in his 30s for rectal bleeding that was normal.   Past Medical History:  Diagnosis Date   GERD (gastroesophageal reflux disease)    History of gout    06-23-2019  per pt last episode 2018   History of kidney stones    Hyperlipidemia    Phimosis     Past Surgical History:  Procedure Laterality Date   CIRCUMCISION N/A 06/26/2019   Procedure: CIRCUMCISION ADULT;  Surgeon: Trent Frizzle, MD;  Location: [facility];  Service: Urology;  Laterality: N/A;  45 MINS   COLONOSCOPY  1998   normal   WISDOM TOOTH EXTRACTION      Prior to Admission medications   Medication Sig Start Date End Date Taking? Authorizing Provider  acetaminophen  (TYLENOL ) 500 MG tablet Take 500 mg by mouth every 6 (six) hours as needed.   Yes [provider]  aluminum hydroxide-magnesium carbonate (GAVISCON) 95-358 MG/15ML SUSP Take by mouth.   Yes [provider]  cyclobenzaprine  (FLEXERIL ) 10 MG tablet Take 1 tablet (10 mg total) by mouth 3 (three) times daily as needed for muscle spasms. 10/22/21  Yes Wurst, [place], PA-C  aspirin 500 MG tablet Take 500 mg by mouth in the morning and at bedtime.    [provider]  colchicine  0.6  MG tablet Day1: Take 2 tablets by mouth once. Then take 1 tablet an hour later. Day 2 on, Take 1 tablet twice daily. Patient not taking: Reported on 03/14/2023 02/14/22   Burnette, Jennifer M, PA-C  ibuprofen (ADVIL) 200 MG tablet Take 800 mg by mouth every 6 (six) hours as needed.    [provider]  rosuvastatin  (CRESTOR ) 10 MG tablet Take 1 tablet (10 mg total) by mouth daily. Patient not taking: Reported on 02/17/2024 02/14/23   Sagardia, Miguel Jose, MD    Current Outpatient Medications  Medication Sig Dispense Refill   acetaminophen  (TYLENOL ) 500 MG tablet Take 500 mg by mouth every 6 (six) hours as needed.     aluminum hydroxide-magnesium carbonate (GAVISCON) 95-358 MG/15ML SUSP Take by mouth.     cyclobenzaprine  (FLEXERIL ) 10 MG tablet Take 1 tablet (10 mg total) by mouth 3 (three) times daily as needed for muscle spasms. 30 tablet 0   aspirin 500 MG tablet Take 500 mg by mouth in the morning and at bedtime.     colchicine  0.6 MG tablet Day1: Take 2 tablets by mouth once. Then take 1 tablet an hour later. Day 2 on, Take 1 tablet twice daily. (Patient not taking: Reported on 03/14/2023) 10 tablet 0   ibuprofen (ADVIL) 200 MG tablet Take 800 mg by mouth every 6 (six) hours as needed.     rosuvastatin  (CRESTOR ) 10 MG tablet Take 1 tablet (10 mg total) by mouth daily. (Patient  not taking: Reported on 02/17/2024) 90 tablet 3   Current Facility-Administered Medications  Medication Dose Route Frequency Provider Last Rate Last Admin   0.9 %  sodium chloride  infusion  500 mL Intravenous Once Obdulia Steier C, MD        Allergies as of 03/12/2024   (No Known Allergies)    Family History  Problem Relation Age of Onset   Esophageal cancer Paternal Grandfather    Colon cancer Neg Hx    Colon polyps Neg Hx    Prostate cancer Neg Hx    Rectal cancer Neg Hx     Social History   Socioeconomic History   Marital status: Married    Spouse name: Not on file   Number of children: Not on file    Years of education: Not on file   Highest education level: Master's degree (e.g., MA, MS, MEng, MEd, MSW, MBA)  Occupational History   Not on file  Tobacco Use   Smoking status: Never   Smokeless tobacco: Never  Vaping Use   Vaping status: Never Used  Substance and Sexual Activity   Alcohol use: Yes    Alcohol/week: 14.0 standard drinks of alcohol    Types: 14 Cans of beer per week    Comment: 2 beer daily   Drug use: Never   Sexual activity: Not on file  Other Topics Concern   Not on file  Social History Narrative   Not on file   Social Drivers of Health   Financial Resource Strain: Low Risk  (02/11/2024)   Overall Financial Resource Strain (CARDIA)    Difficulty of Paying Living Expenses: Not hard at all  Food Insecurity: No Food Insecurity (02/11/2024)   Hunger Vital Sign    Worried About Running Out of Food in the Last Year: Never true    Ran Out of Food in the Last Year: Never true  Transportation Needs: No Transportation Needs (02/11/2024)   PRAPARE - Administrator, Civil Service (Medical): No    Lack of Transportation (Non-Medical): No  Physical Activity: Insufficiently Active (02/11/2024)   Exercise Vital Sign    Days of Exercise per Week: 3 days    Minutes of Exercise per Session: 40 min  Stress: No Stress Concern Present (02/11/2024)   [employer] of Occupational Health - Occupational Stress Questionnaire    Feeling of Stress : Only a little  Social Connections: Socially Isolated (02/11/2024)   Social Connection and Isolation Panel [NHANES]    Frequency of Communication with Friends and Family: Once a week    Frequency of Social Gatherings with Friends and Family: Never    Attends Religious Services: Never    Database administrator or Organizations: No    Attends Engineer, structural: Not on file    Marital Status: Married  Catering manager Violence: Not on file    Physical Exam: Vital signs in last 24 hours: BP 118/80   Pulse (!) 58    Temp 98.2 F (36.8 C) (Temporal)   Ht 6\' 4"  (1.93 m)   Wt (!) 321 lb (145.6 kg)   SpO2 97%   BMI 39.07 kg/m  GEN: NAD EYE: Sclerae anicteric ENT: MMM CV: Non-tachycardic Pulm: No increased work of breathing GI: Soft, NT/ND NEURO:  Alert & Oriented   Regino Caprio, MD [facility]  03/12/2024 8:21 AM

## 2024-03-12 NOTE — Progress Notes (Signed)
 Called to room to assist during endoscopic procedure.  Patient ID and intended procedure confirmed with present staff. Received instructions for my participation in the procedure from the performing physician.

## 2024-03-12 NOTE — Op Note (Signed)
 [facility] Patient Name: Antonio Mccullough Procedure Date: 03/12/2024 7:28 AM MRN: [id number] Endoscopist: Freada Jacobs Wanchese , , [id number] Age: 58 Referring MD:  Date of Birth: [birth year] Gender: Male Account #: [id number] Procedure:                Colonoscopy Indications:              Screening for colorectal malignant neoplasm Medicines:                Monitored Anesthesia Care Procedure:                Pre-Anesthesia Assessment:                           - Prior to the procedure, a History and Physical                            was performed, and patient medications and                            allergies were reviewed. The patient's tolerance of                            previous anesthesia was also reviewed. The risks                            and benefits of the procedure and the sedation                            options and risks were discussed with the patient.                            All questions were answered, and informed consent                            was obtained. Prior Anticoagulants: The patient has                            taken no anticoagulant or antiplatelet agents                            except for aspirin. ASA Grade Assessment: III - A                            patient with severe systemic disease. After                            reviewing the risks and benefits, the patient was                            deemed in satisfactory condition to undergo the                            procedure.  After obtaining informed consent, the colonoscope                            was passed under direct vision. Throughout the                            procedure, the patient's blood pressure, pulse, and                            oxygen saturations were monitored continuously. The                            CF [id number] #[id number] was introduced through the anus                            and advanced to the the terminal ileum.  The                            colonoscopy was performed without difficulty. The                            patient tolerated the procedure well. The quality                            of the bowel preparation was good. The terminal                            ileum, ileocecal valve, appendiceal orifice, and                            rectum were photographed. Scope In: 8:25:21 AM Scope Out: 8:51:29 AM Scope Withdrawal Time: 0 hours 21 minutes 48 seconds  Total Procedure Duration: 0 hours 26 minutes 8 seconds  Findings:                 The terminal ileum appeared normal.                           Seven sessile polyps were found in the transverse                            colon, ascending colon and cecum. The polyps were 3                            to 6 mm in size. These polyps were removed with a                            cold snare. Resection and retrieval were complete.                           Two sessile polyps were found in the sigmoid colon                            and descending colon.  The polyps were 4 to 7 mm in                            size. These polyps were removed with a cold snare.                            Resection and retrieval were complete.                           Multiple diverticula were found in the sigmoid                            colon and descending colon.                           Non-bleeding internal hemorrhoids were found during                            retroflexion. Complications:            No immediate complications. Estimated Blood Loss:     Estimated blood loss was minimal. Impression:               - The examined portion of the ileum was normal.                           - Seven 3 to 6 mm polyps in the transverse colon,                            in the ascending colon and in the cecum, removed                            with a cold snare. Resected and retrieved.                           - Two 4 to 7 mm polyps in the sigmoid colon and in                             the descending colon, removed with a cold snare.                            Resected and retrieved.                           - Diverticulosis in the sigmoid colon and in the                            descending colon.                           - Non-bleeding internal hemorrhoids. Recommendation:           - Discharge patient to home (with escort).                           -  Await pathology results.                           - The findings and recommendations were discussed                            with the patient. Dr Pedro Bourgeois "Anastacio Balm" Rosaline Coma,  03/12/2024 9:05:47 AM

## 2024-03-12 NOTE — Patient Instructions (Signed)
 Resume previous diet and medications. Awaiting pathology results. Repeat Colonoscopy date to be determined based on pathology results. Handouts provided on Colon polyps, Diverticulosis and Hemorrhoids   YOU HAD AN ENDOSCOPIC PROCEDURE TODAY AT THE [facility]:   Refer to the procedure report that was given to you for any specific questions about what was found during the examination.  If the procedure report does not answer your questions, please call your gastroenterologist to clarify.  If you requested that your care partner not be given the details of your procedure findings, then the procedure report has been included in a sealed envelope for you to review at your convenience later.  YOU SHOULD EXPECT: Some feelings of bloating in the abdomen. Passage of more gas than usual.  Walking can help get rid of the air that was put into your GI tract during the procedure and reduce the bloating. If you had a lower endoscopy (such as a colonoscopy or flexible sigmoidoscopy) you may notice spotting of blood in your stool or on the toilet paper. If you underwent a bowel prep for your procedure, you may not have a normal bowel movement for a few days.  Please Note:  You might notice some irritation and congestion in your nose or some drainage.  This is from the oxygen used during your procedure.  There is no need for concern and it should clear up in a day or so.  SYMPTOMS TO REPORT IMMEDIATELY:  Following lower endoscopy (colonoscopy or flexible sigmoidoscopy):  Excessive amounts of blood in the stool  Significant tenderness or worsening of abdominal pains  Swelling of the abdomen that is new, acute  Fever of 100F or higher  For urgent or emergent issues, a gastroenterologist can be reached at any hour by calling [phone]. Do not use MyChart messaging for urgent concerns.    DIET:  We do recommend a small meal at first, but then you may proceed to your regular diet.  Drink plenty of  fluids but you should avoid alcoholic beverages for 24 hours.  ACTIVITY:  You should plan to take it easy for the rest of today and you should NOT DRIVE or use heavy machinery until tomorrow (because of the sedation medicines used during the test).    FOLLOW UP: Our staff will call the number listed on your records the next business day following your procedure.  We will call around 7:15- 8:00 am to check on you and address any questions or concerns that you may have regarding the information given to you following your procedure. If we do not reach you, we will leave a message.     If any biopsies were taken you will be contacted by phone or by letter within the next 1-3 weeks.  Please call us at [phone] if you have not heard about the biopsies in 3 weeks.    SIGNATURES/CONFIDENTIALITY: You and/or your care partner have signed paperwork which will be entered into your electronic medical record.  These signatures attest to the fact that that the information above on your After Visit Summary has been reviewed and is understood.  Full responsibility of the confidentiality of this discharge information lies with you and/or your care-partner.

## 2024-03-12 NOTE — Progress Notes (Signed)
 Pt A/O x 3, gd SR's, pleased with anesthesia, report to RN

## 2024-03-13 ENCOUNTER — Telehealth: Payer: Self-pay | Admitting: *Deleted

## 2024-03-13 NOTE — Telephone Encounter (Signed)
  Follow up Call-     03/12/2024    7:36 AM  Call back number  Post procedure Call Back phone  # [phone]  Permission to leave phone message Yes     Patient questions:  Message left to call us  if necessary.

## 2024-03-16 LAB — SURGICAL PATHOLOGY

## 2024-03-22 ENCOUNTER — Encounter: Payer: Self-pay | Admitting: Internal Medicine
